# Patient Record
Sex: Male | Born: 1955 | Race: White | Hispanic: No | Marital: Married | Smoking: Never smoker
Health system: Southern US, Community
[De-identification: ages and names within clinical notes are randomized; demographics above are authoritative.]

## PROBLEM LIST (undated history)

## (undated) DIAGNOSIS — E785 Hyperlipidemia, unspecified: Secondary | ICD-10-CM

## (undated) DIAGNOSIS — E119 Type 2 diabetes mellitus without complications: Secondary | ICD-10-CM

## (undated) DIAGNOSIS — I10 Essential (primary) hypertension: Secondary | ICD-10-CM

## (undated) HISTORY — PX: KNEE SURGERY: SHX244

## (undated) HISTORY — PX: TONSILLECTOMY: SUR1361

---

## 2019-04-04 DIAGNOSIS — R350 Frequency of micturition: Secondary | ICD-10-CM | POA: Insufficient documentation

## 2019-04-04 DIAGNOSIS — N5203 Combined arterial insufficiency and corporo-venous occlusive erectile dysfunction: Secondary | ICD-10-CM | POA: Insufficient documentation

## 2019-04-08 DIAGNOSIS — N486 Induration penis plastica: Secondary | ICD-10-CM | POA: Insufficient documentation

## 2019-06-02 ENCOUNTER — Emergency Department (HOSPITAL_BASED_OUTPATIENT_CLINIC_OR_DEPARTMENT_OTHER)
Admission: EM | Admit: 2019-06-02 | Discharge: 2019-06-02 | Disposition: A | Discharge status: Home / Self Care | Payer: 59 | Attending: Emergency Medicine | Admitting: Emergency Medicine

## 2019-06-02 ENCOUNTER — Emergency Department (HOSPITAL_BASED_OUTPATIENT_CLINIC_OR_DEPARTMENT_OTHER): Payer: 59

## 2019-06-02 ENCOUNTER — Other Ambulatory Visit: Payer: Self-pay

## 2019-06-02 ENCOUNTER — Encounter (HOSPITAL_BASED_OUTPATIENT_CLINIC_OR_DEPARTMENT_OTHER): Payer: Self-pay

## 2019-06-02 DIAGNOSIS — E785 Hyperlipidemia, unspecified: Secondary | ICD-10-CM | POA: Insufficient documentation

## 2019-06-02 DIAGNOSIS — Z7984 Long term (current) use of oral hypoglycemic drugs: Secondary | ICD-10-CM | POA: Insufficient documentation

## 2019-06-02 DIAGNOSIS — E119 Type 2 diabetes mellitus without complications: Secondary | ICD-10-CM | POA: Diagnosis not present

## 2019-06-02 DIAGNOSIS — I10 Essential (primary) hypertension: Secondary | ICD-10-CM | POA: Insufficient documentation

## 2019-06-02 DIAGNOSIS — Z79899 Other long term (current) drug therapy: Secondary | ICD-10-CM | POA: Diagnosis not present

## 2019-06-02 DIAGNOSIS — R079 Chest pain, unspecified: Secondary | ICD-10-CM

## 2019-06-02 DIAGNOSIS — R0789 Other chest pain: Secondary | ICD-10-CM | POA: Insufficient documentation

## 2019-06-02 HISTORY — DX: Type 2 diabetes mellitus without complications: E11.9

## 2019-06-02 HISTORY — DX: Essential (primary) hypertension: I10

## 2019-06-02 HISTORY — DX: Hyperlipidemia, unspecified: E78.5

## 2019-06-02 LAB — BASIC METABOLIC PANEL
Anion gap: 9 (ref 5–15)
BUN: 15 mg/dL (ref 8–23)
CO2: 23 mmol/L (ref 22–32)
Calcium: 9 mg/dL (ref 8.9–10.3)
Chloride: 104 mmol/L (ref 98–111)
Creatinine, Ser: 0.86 mg/dL (ref 0.61–1.24)
GFR calc Af Amer: >60 mL/min (ref >60)
GFR calc non Af Amer: >60 mL/min (ref >60)
Glucose, Bld: 210 mg/dL — ABNORMAL HIGH (ref 70–99)
Potassium: 4.4 mmol/L (ref 3.5–5.1)
Sodium: 136 mmol/L (ref 135–145)

## 2019-06-02 LAB — HEPATIC FUNCTION PANEL
ALT: 25 U/L (ref 0–44)
AST: 21 U/L (ref 15–41)
Albumin: 4.3 g/dL (ref 3.5–5.0)
Alkaline Phosphatase: 87 U/L (ref 38–126)
Bilirubin, Direct: 0.1 mg/dL (ref 0.0–0.2)
Indirect Bilirubin: 0.5 mg/dL (ref 0.3–0.9)
Total Bilirubin: 0.6 mg/dL (ref 0.3–1.2)
Total Protein: 7.3 g/dL (ref 6.5–8.1)

## 2019-06-02 LAB — CBC WITH DIFFERENTIAL/PLATELET
Abs Immature Granulocytes: 0.02 10*3/uL (ref 0.00–0.07)
Basophils Absolute: 0.1 10*3/uL (ref 0.0–0.1)
Basophils Relative: 1 %
Eosinophils Absolute: 0.2 10*3/uL (ref 0.0–0.5)
Eosinophils Relative: 3 %
HCT: 42.5 % (ref 39.0–52.0)
Hemoglobin: 14.4 g/dL (ref 13.0–17.0)
Immature Granulocytes: 0 %
Lymphocytes Relative: 22 %
Lymphs Abs: 1.2 10*3/uL (ref 0.7–4.0)
MCH: 30.4 pg (ref 26.0–34.0)
MCHC: 33.9 g/dL (ref 30.0–36.0)
MCV: 89.7 fL (ref 80.0–100.0)
Monocytes Absolute: 0.7 10*3/uL (ref 0.1–1.0)
Monocytes Relative: 12 %
Neutro Abs: 3.5 10*3/uL (ref 1.7–7.7)
Neutrophils Relative %: 62 %
Platelets: 221 10*3/uL (ref 150–400)
RBC: 4.74 MIL/uL (ref 4.22–5.81)
RDW: 13 % (ref 11.5–15.5)
WBC: 5.6 10*3/uL (ref 4.0–10.5)
nRBC: 0 % (ref 0.0–0.2)

## 2019-06-02 LAB — LIPASE, BLOOD: Lipase: 34 U/L (ref 11–51)

## 2019-06-02 LAB — TROPONIN I (HIGH SENSITIVITY)
Troponin I (High Sensitivity): 3 ng/L (ref <18)
Troponin I (High Sensitivity): 3 ng/L (ref <18)

## 2019-06-02 NOTE — ED Notes (Signed)
ED Provider at bedside. 

## 2019-06-02 NOTE — ED Provider Notes (Signed)
[facility] EMERGENCY DEPARTMENT Provider Note   CSN: [id number] Arrival date & time: 06/02/19  1442     History   Chief Complaint Chief Complaint  Patient presents with  . Chest Pain    HPI Sean Buckley is a 63 y.o. male.     The history is provided by the patient.  Chest Pain Pain location:  Substernal area Pain quality: aching   Pain radiates to:  L jaw Pain severity:  Mild Onset quality:  Gradual Duration:  1 hour Progression:  Resolved Chronicity:  New Context: at rest   Relieved by:  Nothing Worsened by:  Nothing Associated symptoms: no abdominal pain, no back pain, no cough, no fever, no palpitations, no shortness of breath and no vomiting   Risk factors: diabetes mellitus and high cholesterol   Risk factors: no coronary artery disease, no hypertension (not on medications) and no prior DVT/PE     Past Medical History:  Diagnosis Date  . Diabetes mellitus without complication ([place])   . Hyperlipidemia   . Hypertension     There are no active problems to display for this patient.   Past Surgical History:  Procedure Laterality Date  . KNEE SURGERY Left         Home Medications    Prior to Admission medications   Medication Sig Start Date End Date Taking? Authorizing Provider  ALPRAZolam (XANAX) 1 MG tablet TAKE 1/2 TAB(S) AT BEDTIME AS NEEDED ORALLY 60 DAYS 02/20/19  Yes [provider]  atorvastatin (LIPITOR) 40 MG tablet TAKE 1 TABLET BY MOUTH EVERYDAY AT BEDTIME 01/18/19  Yes [provider]  buPROPion (WELLBUTRIN XL) 150 MG 24 hr tablet TAKE 2 TAB(S) EVERY MORNING 1 TAB EVERY EVENING 2 TIMES DAILY ORALLY 90 DAYS 02/21/19  Yes [provider]  metFORMIN (GLUCOPHAGE) 500 MG tablet Take 500 mg by mouth 2 (two) times daily with a meal.   Yes [provider]    Family History No family history on file.  Social History Social History   Tobacco Use  . Smoking status: Never Smoker  . Smokeless tobacco:  Never Used  Substance Use Topics  . Alcohol use: Yes    Comment: 2 glasses of wine per week   . Drug use: Never     Allergies   Patient has no allergy information on record.   Review of Systems Review of Systems  Constitutional: Negative for chills and fever.  HENT: Negative for ear pain and sore throat.   Eyes: Negative for pain and visual disturbance.  Respiratory: Negative for cough and shortness of breath.   Cardiovascular: Positive for chest pain. Negative for palpitations.  Gastrointestinal: Negative for abdominal pain and vomiting.  Genitourinary: Negative for dysuria and hematuria.  Musculoskeletal: Negative for arthralgias and back pain.  Skin: Negative for color change and rash.  Neurological: Negative for seizures and syncope.  All other systems reviewed and are negative.    Physical Exam Updated Vital Signs  ED Triage Vitals  Enc Vitals Group     BP 06/02/19 1453 137/70     Pulse Rate 06/02/19 1453 83     Resp 06/02/19 1453 14     Temp 06/02/19 1453 98.8 F (37.1 C)     Temp Source 06/02/19 1453 Oral     SpO2 06/02/19 1453 97 %     Weight 06/02/19 1447 220 lb (99.8 kg)     Height 06/02/19 1447 6\' 2"  (1.88 m)     Head Circumference --  Peak Flow --      Pain Score 06/02/19 1447 0     Pain Loc --      Pain Edu? --      Excl. in GC? --     Physical Exam Vitals signs and nursing note reviewed.  Constitutional:      Appearance: He is well-developed.  HENT:     Head: Normocephalic and atraumatic.  Eyes:     Extraocular Movements: Extraocular movements intact.     Conjunctiva/sclera: Conjunctivae normal.     Pupils: Pupils are equal, round, and reactive to light.  Neck:     Musculoskeletal: Normal range of motion and neck supple.  Cardiovascular:     Rate and Rhythm: Normal rate and regular rhythm.     Pulses:          Radial pulses are 2+ on the right side and 2+ on the left side.       Dorsalis pedis pulses are 2+ on the right side and 2+  on the left side.     Heart sounds: Normal heart sounds. No murmur.  Pulmonary:     Effort: Pulmonary effort is normal. No respiratory distress.     Breath sounds: Normal breath sounds. No decreased breath sounds, wheezing, rhonchi or rales.  Abdominal:     Palpations: Abdomen is soft.     Tenderness: There is no abdominal tenderness.  Musculoskeletal: Normal range of motion.  Skin:    General: Skin is warm and dry.     Capillary Refill: Capillary refill takes less than 2 seconds.  Neurological:     General: No focal deficit present.     Mental Status: He is alert.  Psychiatric:        Mood and Affect: Mood normal.      ED Treatments / Results  Labs (all labs ordered are listed, but only abnormal results are displayed) Labs Reviewed  BASIC METABOLIC PANEL - Abnormal; Notable for the following components:      Result Value   Glucose, Bld 210 (*)    All other components within normal limits  CBC WITH DIFFERENTIAL/PLATELET  HEPATIC FUNCTION PANEL  LIPASE, BLOOD  TROPONIN I (HIGH SENSITIVITY)  TROPONIN I (HIGH SENSITIVITY)    EKG EKG Interpretation  Date/Time:  Thursday June 02 2019 14:48:39 EST Ventricular Rate:  84 PR Interval:  164 QRS Duration: 82 QT Interval:  358 QTC Calculation: 423 R Axis:   82 Text Interpretation: Normal sinus rhythm Normal ECG Confirmed by Virgina Norfolk [phone]) on 06/02/2019 2:57:00 PM   Radiology Dg Chest Portable 1 View  Result Date: 06/02/2019 CLINICAL DATA:  Chest pain. EXAM: PORTABLE CHEST 1 VIEW COMPARISON:  None. FINDINGS: The heart size and mediastinal contours are within normal limits. Both lungs are clear. The visualized skeletal structures are unremarkable. IMPRESSION: No active disease. Electronically Signed   By: Gerome Sam III M.D   On: 06/02/2019 15:27    Procedures Procedures (including critical care time)  Medications Ordered in ED Medications - No data to display   Initial Impression / Assessment and  Plan / ED Course  I have reviewed the triage vital signs and the nursing notes.  Pertinent labs & imaging results that were available during my care of the patient were reviewed by me and considered in my medical decision making (see chart for details).     Sean Buckley is a 63 year old male with history of high cholesterol, diabetes who presents to the ED with chest pain.  Patient with unremarkable vitals.  No fever.  Patient had episode of chest pain about an hour ago that was pressure-like and possibly radiating to his jaw.  However patient states that he has some jaw pain at baseline due to dental problems.  Is not having pain now.  States that maybe he had an episode of pain 2 days ago.  Patient denies having high blood pressure.  Used to be on blood pressure medication in the past but has not been on it for several months.  Patient does not smoke.  No history of blood clots.  Does not have any risk factors for PE.  Wells criteria 0.  Heart score is 3.  EKG shows sinus rhythm.  No ischemic changes.  Will get 2 troponins to evaluate for ACS.  Does not have any infectious symptoms.  Will get labs, chest x-ray.  No significant anemia, electrolyte abnormality, kidney injury.  Troponin normal x2.  Chest x-ray with no signs of pneumonia, no pneumothorax, no pleural effusion.  Lipase normal doubt pancreatitis.  Overall, recommend close outpatient follow-up with cardiology for further work-up.  Patient prefers this as well, patient does have a heart score of 3 and think that it is reasonable for close outpatient follow-up to discuss possible stress test.  Given return precautions and discharged in ED good condition.  This chart was dictated using voice recognition software.  Despite best efforts to proofread,  errors can occur which can change the documentation meaning.    Final Clinical Impressions(s) / ED Diagnoses   Final diagnoses:  Chest pain, unspecified type    ED Discharge Orders    None        Virgina NorfolkCuratolo, Shamus Desantis, DO 06/02/19 1823

## 2019-06-02 NOTE — ED Triage Notes (Signed)
Pt reports that he has been having chest pain for a few days reporting that his wife made him come. He reports that he has had chest pain off and on for years, does not see a cardiologist.

## 2019-06-02 NOTE — ED Notes (Signed)
Pt reports he had substernal CP with an onset while at rest. Pt reports the pain lasted for 15 minutes and then was resolved. Happened x2 over past couple of days. Pt reports he has some jaw pain which pt relates to being a chronic pain from a previous dental surgery. Pt denies ShOB, nausea, diaphoresis with onset. Pt is pain free at this time.

## 2020-04-15 ENCOUNTER — Encounter (HOSPITAL_BASED_OUTPATIENT_CLINIC_OR_DEPARTMENT_OTHER): Payer: Self-pay | Admitting: Emergency Medicine

## 2020-04-15 ENCOUNTER — Other Ambulatory Visit: Payer: Self-pay

## 2020-04-15 ENCOUNTER — Emergency Department (HOSPITAL_BASED_OUTPATIENT_CLINIC_OR_DEPARTMENT_OTHER)
Admission: EM | Admit: 2020-04-15 | Discharge: 2020-04-16 | Disposition: A | Discharge status: Home / Self Care | Payer: BC Managed Care – PPO | Attending: Emergency Medicine | Admitting: Emergency Medicine

## 2020-04-15 DIAGNOSIS — E119 Type 2 diabetes mellitus without complications: Secondary | ICD-10-CM | POA: Insufficient documentation

## 2020-04-15 DIAGNOSIS — R109 Unspecified abdominal pain: Secondary | ICD-10-CM | POA: Diagnosis present

## 2020-04-15 DIAGNOSIS — K5900 Constipation, unspecified: Secondary | ICD-10-CM | POA: Diagnosis not present

## 2020-04-15 DIAGNOSIS — I10 Essential (primary) hypertension: Secondary | ICD-10-CM | POA: Insufficient documentation

## 2020-04-15 DIAGNOSIS — Z7984 Long term (current) use of oral hypoglycemic drugs: Secondary | ICD-10-CM | POA: Diagnosis not present

## 2020-04-15 DIAGNOSIS — Z20822 Contact with and (suspected) exposure to covid-19: Secondary | ICD-10-CM | POA: Insufficient documentation

## 2020-04-15 DIAGNOSIS — R1084 Generalized abdominal pain: Secondary | ICD-10-CM

## 2020-04-15 LAB — COMPREHENSIVE METABOLIC PANEL
ALT: 29 U/L (ref 0–44)
AST: 28 U/L (ref 15–41)
Albumin: 4.3 g/dL (ref 3.5–5.0)
Alkaline Phosphatase: 67 U/L (ref 38–126)
Anion gap: 10 (ref 5–15)
BUN: 12 mg/dL (ref 8–23)
CO2: 24 mmol/L (ref 22–32)
Calcium: 8.8 mg/dL — ABNORMAL LOW (ref 8.9–10.3)
Chloride: 104 mmol/L (ref 98–111)
Creatinine, Ser: 0.75 mg/dL (ref 0.61–1.24)
GFR calc Af Amer: >60 mL/min (ref >60)
GFR calc non Af Amer: >60 mL/min (ref >60)
Glucose, Bld: 148 mg/dL — ABNORMAL HIGH (ref 70–99)
Potassium: 3.8 mmol/L (ref 3.5–5.1)
Sodium: 138 mmol/L (ref 135–145)
Total Bilirubin: 0.8 mg/dL (ref 0.3–1.2)
Total Protein: 7.2 g/dL (ref 6.5–8.1)

## 2020-04-15 LAB — CBC
HCT: 39.7 % (ref 39.0–52.0)
Hemoglobin: 13.7 g/dL (ref 13.0–17.0)
MCH: 30.9 pg (ref 26.0–34.0)
MCHC: 34.5 g/dL (ref 30.0–36.0)
MCV: 89.4 fL (ref 80.0–100.0)
Platelets: 189 10*3/uL (ref 150–400)
RBC: 4.44 MIL/uL (ref 4.22–5.81)
RDW: 12.6 % (ref 11.5–15.5)
WBC: 5.9 10*3/uL (ref 4.0–10.5)
nRBC: 0 % (ref 0.0–0.2)

## 2020-04-15 LAB — LIPASE, BLOOD: Lipase: 32 U/L (ref 11–51)

## 2020-04-15 MED ORDER — SODIUM CHLORIDE 0.9 % IV BOLUS
1000.0000 mL | Freq: Once | INTRAVENOUS | Status: AC
  Administered 2020-04-16: 1000 mL via INTRAVENOUS

## 2020-04-15 MED ORDER — ONDANSETRON HCL 4 MG/2ML IJ SOLN
4.0000 mg | Freq: Once | INTRAMUSCULAR | Status: AC
  Administered 2020-04-16: 4 mg via INTRAVENOUS
  Filled 2020-04-15: qty 2

## 2020-04-15 NOTE — ED Triage Notes (Signed)
Pt states he has not had a bowel movement in 10 days  Pt is c/o nausea and states he has had a fever  Pt is having abd pain  Pt states he has been able to pass gas  Pt states he has passed some stool but it is hard

## 2020-04-16 ENCOUNTER — Emergency Department (HOSPITAL_BASED_OUTPATIENT_CLINIC_OR_DEPARTMENT_OTHER): Payer: BC Managed Care – PPO

## 2020-04-16 ENCOUNTER — Encounter (HOSPITAL_BASED_OUTPATIENT_CLINIC_OR_DEPARTMENT_OTHER): Payer: Self-pay

## 2020-04-16 LAB — URINALYSIS, ROUTINE W REFLEX MICROSCOPIC
Bilirubin Urine: NEGATIVE
Glucose, UA: NEGATIVE mg/dL
Hgb urine dipstick: NEGATIVE
Ketones, ur: NEGATIVE mg/dL
Leukocytes,Ua: NEGATIVE
Nitrite: NEGATIVE
Protein, ur: NEGATIVE mg/dL
Specific Gravity, Urine: 1.01 (ref 1.005–1.030)
pH: 6 (ref 5.0–8.0)

## 2020-04-16 LAB — RESPIRATORY PANEL BY RT PCR (FLU A&B, COVID)
Influenza A by PCR: NEGATIVE
Influenza B by PCR: NEGATIVE
SARS Coronavirus 2 by RT PCR: NEGATIVE

## 2020-04-16 MED ORDER — IOHEXOL 300 MG/ML  SOLN
100.0000 mL | Freq: Once | INTRAMUSCULAR | Status: AC | PRN
  Administered 2020-04-16: 100 mL via INTRAVENOUS

## 2020-04-16 MED ORDER — DIPHENHYDRAMINE HCL 50 MG/ML IJ SOLN
25.0000 mg | Freq: Once | INTRAMUSCULAR | Status: AC
  Administered 2020-04-16: 25 mg via INTRAVENOUS
  Filled 2020-04-16: qty 1

## 2020-04-16 NOTE — ED Notes (Signed)
Patient transported to CT 

## 2020-04-16 NOTE — ED Notes (Signed)
Pt returned from CT °

## 2020-04-16 NOTE — ED Provider Notes (Signed)
[facility] EMERGENCY DEPARTMENT Provider Note   CSN: [id number] Arrival date & time: 04/15/20  2116     History Chief Complaint  Patient presents with  . Constipation    Aries Townley is a 64 y.o. male.  HPI     This is a 64 year old male with history of diabetes, hypertension, hyperlipidemia who presents with constipation, abdominal pain, nausea.  Patient reports he has recently had a change in diet.  Over the last 10 days he has had difficulty having a bowel movement.  He reports using multiple medications including stool softeners and fleets enema with minimal results.  Bowel movements he has had have been very hard and small.  Today he developed chills and nausea.  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  He is vaccinated.  Patient reports some left-sided abdominal discomfort that is nonradiating and dull in nature.  He denies any vomiting, known fevers, urinary symptoms.  Past Medical History:  Diagnosis Date  . Diabetes mellitus without complication (H[facility])   . Hyperlipidemia   . Hypertension     There are no problems to display for this patient.   Past Surgical History:  Procedure Laterality Date  . KNEE SURGERY Left        Family History  Problem Relation Age of Onset  . Diabetes Other   . Cancer Other   . Rheum arthritis Other     Social History   Tobacco Use  . Smoking status: Never Smoker  . Smokeless tobacco: Never Used  Vaping Use  . Vaping Use: Never used  Substance Use Topics  . Alcohol use: Yes    Comment: 10 oz per week   . Drug use: Never    Home Medications Prior to Admission medications   Medication Sig Start Date End Date Taking? Authorizing Provider  ALPRAZolam (XANAX) 1 MG tablet TAKE 1/2 TAB(S) AT BEDTIME AS NEEDED ORALLY 60 DAYS 02/20/19   [provider]  atorvastatin (LIPITOR) 40 MG tablet TAKE 1 TABLET BY MOUTH EVERYDAY AT BEDTIME 01/18/19   [provider]  buPROPion (WELLBUTRIN XL) 150 MG 24 hr tablet TAKE 2 TAB(S) EVERY MORNING 1 TAB EVERY EVENING 2 TIMES DAILY ORALLY 90 DAYS 02/21/19   [provider]  metFORMIN (GLUCOPHAGE) 500 MG tablet Take 500 mg by mouth 2 (two) times daily with a meal.    [provider]    Allergies    Patient has no known allergies.  Review of Systems   Review of Systems  Constitutional: Positive for chills and fever.  Respiratory: Negative for shortness of breath.   Gastrointestinal: Positive for constipation and nausea. Negative for blood in stool, diarrhea and vomiting.  Genitourinary: Negative for dysuria.  All other systems reviewed and are negative.   Physical Exam Updated Vital Signs BP (!) 138/98 (BP Location: Left Arm)   Pulse 78   Temp 100.3 F (37.9 C) (Oral)   Resp 20   Ht 1.88 m (6\' 2" )   Wt 98 kg   SpO2 99%   BMI 27.73 kg/m   Physical Exam Vitals and nursing note reviewed.  Constitutional:      Appearance: He is well-developed. He is not ill-appearing.  HENT:     Head: Normocephalic and atraumatic.     Mouth/Throat:     Mouth: Mucous membranes are dry.  Eyes:     Pupils: Pupils are equal, round, and reactive to light.  Cardiovascular:  Rate and Rhythm: Normal rate and regular rhythm.     Heart sounds: Normal heart sounds. No murmur heard.   Pulmonary:     Effort: Pulmonary effort is normal. No respiratory distress.     Breath sounds: Normal breath sounds. No wheezing.  Abdominal:     Palpations: Abdomen is soft.     Tenderness: There is no abdominal tenderness. There is no rebound.     Comments: Slightly increased bowel sounds, diffuse tenderness to palpation mostly over the left upper and lower quadrants  Musculoskeletal:     Cervical back: Neck supple.     Right lower leg: No edema.     Left lower leg: No edema.  Lymphadenopathy:     Cervical: No cervical adenopathy.  Skin:    General: Skin is warm and dry.  Neurological:     Mental  Status: He is alert and oriented to person, place, and time.  Psychiatric:        Mood and Affect: Mood normal.     ED Results / Procedures / Treatments   Labs (all labs ordered are listed, but only abnormal results are displayed) Labs Reviewed  COMPREHENSIVE METABOLIC PANEL - Abnormal; Notable for the following components:      Result Value   Glucose, Bld 148 (*)    Calcium 8.8 (*)    All other components within normal limits  RESPIRATORY PANEL BY RT PCR (FLU A&B, COVID)  LIPASE, BLOOD  CBC  URINALYSIS, ROUTINE W REFLEX MICROSCOPIC    EKG None  Radiology CT ABDOMEN PELVIS W CONTRAST  Result Date: 04/16/2020 CLINICAL DATA:  Nausea and abdominal pain. No bowel movement for 10 days. EXAM: CT ABDOMEN AND PELVIS WITH CONTRAST TECHNIQUE: Multidetector CT imaging of the abdomen and pelvis was performed using the standard protocol following bolus administration of intravenous contrast. CONTRAST:  OMNIPAQUE IOHEXOL 300 MG/ML  SOLN COMPARISON:  None. FINDINGS: Lower chest: The lung bases are clear. Hepatobiliary: No focal liver abnormality is seen. No gallstones, gallbladder wall thickening, or biliary dilatation. Pancreas: Unremarkable. No pancreatic ductal dilatation or surrounding inflammatory changes. Spleen: Normal in size without focal abnormality. Adrenals/Urinary Tract: Adrenal glands are unremarkable. Kidneys are normal, without renal calculi, focal lesion, or hydronephrosis. Bladder is unremarkable. Stomach/Bowel: Stomach is decompressed. Colon is diffusely stool-filled. Fluid-filled nondistended small bowel. No wall thickening or inflammatory changes appreciated. Appendix is normal. Vascular/Lymphatic: Aortic atherosclerosis. No enlarged abdominal or pelvic lymph nodes. Reproductive: Prostate is unremarkable. Other: Small left inguinal hernia containing fat. No free air or free fluid in the abdomen. Musculoskeletal: No destructive bone lesions. Degenerative changes in the spine.  IMPRESSION: 1. Diffusely stool-filled colon consistent with constipation. No evidence of bowel obstruction or inflammation. 2. Small left inguinal hernia containing fat. 3. Aortic atherosclerosis. Aortic Atherosclerosis (ICD10-I70.0). Electronically Signed   By: Burman Nieves M.D.   On: 04/16/2020 01:20    Procedures Procedures (including critical care time)  Medications Ordered in ED Medications  sodium chloride 0.9 % bolus 1,000 mL (1,000 mLs Intravenous New Bag/Given 04/16/20 0019)  ondansetron (ZOFRAN) injection 4 mg (4 mg Intravenous Given 04/16/20 0020)  iohexol (OMNIPAQUE) 300 MG/ML solution 100 mL (100 mLs Intravenous Contrast Given 04/16/20 0105)  diphenhydrAMINE (BENADRYL) injection 25 mg (25 mg Intravenous Given 04/16/20 0020)    ED Course  I have reviewed the triage vital signs and the nursing notes.  Pertinent labs & imaging results that were available during my care of the patient were reviewed by me and considered in my  medical decision making (see chart for details).    MDM Rules/Calculators/A&P                           Patient presents with constipation.  Also reports recent onset of chills.  He is overall nontoxic-appearing and vital signs are notable for temperature of 100.3.  Given his history, the seem like 2 separate issues.  He is concerned for possible Covid although he has been vaccinated.  Additionally regarding his abdominal pain, considerations include constipation, obstruction, less likely intra-abdominal pathology such as cholecystitis or appendicitis.  He does have hyperactive bowel sounds.  Lab work obtained and reviewed.  No significant metabolic derangements, LFTs and lipase are normal.  Covid testing was sent.  CT abdomen and pelvis was obtained to rule out obstructive process.  Covid testing is negative.  CT scan just shows significant constipation.  He is not currently on a laxative but has had stool softeners and enema.  Recommend MiraLAX 1 capful 2-3  times a day.  Follow-up with GI provided.  Kaeo Jacome was evaluated in Emergency Department on 04/16/2020 for the symptoms described in the history of present illness. He was evaluated in the context of the global COVID-19 pandemic, which necessitated consideration that the patient might be at risk for infection with the SARS-CoV-2 virus that causes COVID-19. Institutional protocols and algorithms that pertain to the evaluation of patients at risk for COVID-19 are in a state of rapid change based on information released by regulatory bodies including the CDC and federal and state organizations. These policies and algorithms were followed during the patient's care in the ED.  After history, exam, and medical workup I feel the patient has been appropriately medically screened and is safe for discharge home. Pertinent diagnoses were discussed with the patient. Patient was given return precautions.   Final Clinical Impression(s) / ED Diagnoses Final diagnoses:  Generalized abdominal pain  Constipation, unspecified constipation type    Rx / DC Orders ED Discharge Orders    None       Wilkie Aye, Mayer Masker, MD 04/16/20 [phone]

## 2020-04-16 NOTE — ED Notes (Signed)
Pt states nausea has improved at this time. Zofran held. CT tech at bedside

## 2020-04-16 NOTE — Discharge Instructions (Signed)
You are seen today for abdominal pain and constipation.  Your work-up is reassuring.  There are no obstructions on her CT scan.  You do clinically appear constipated.  Take MiraLAX 1 capful 2-3 times daily to initiate bowel movements.  You may titrate upwards.  You were also noted to have some upper respiratory symptoms and chills.  Your temperature was 100.3.  Covid testing was negative.

## 2020-11-12 IMAGING — DX DG CHEST 1V PORT
2 series · 2 of 2 positions shown · non-contrast
Comparison: None.

CLINICAL DATA: Chest pain.

EXAM:
PORTABLE CHEST 1 VIEW

[chest ap (1 of 2)]
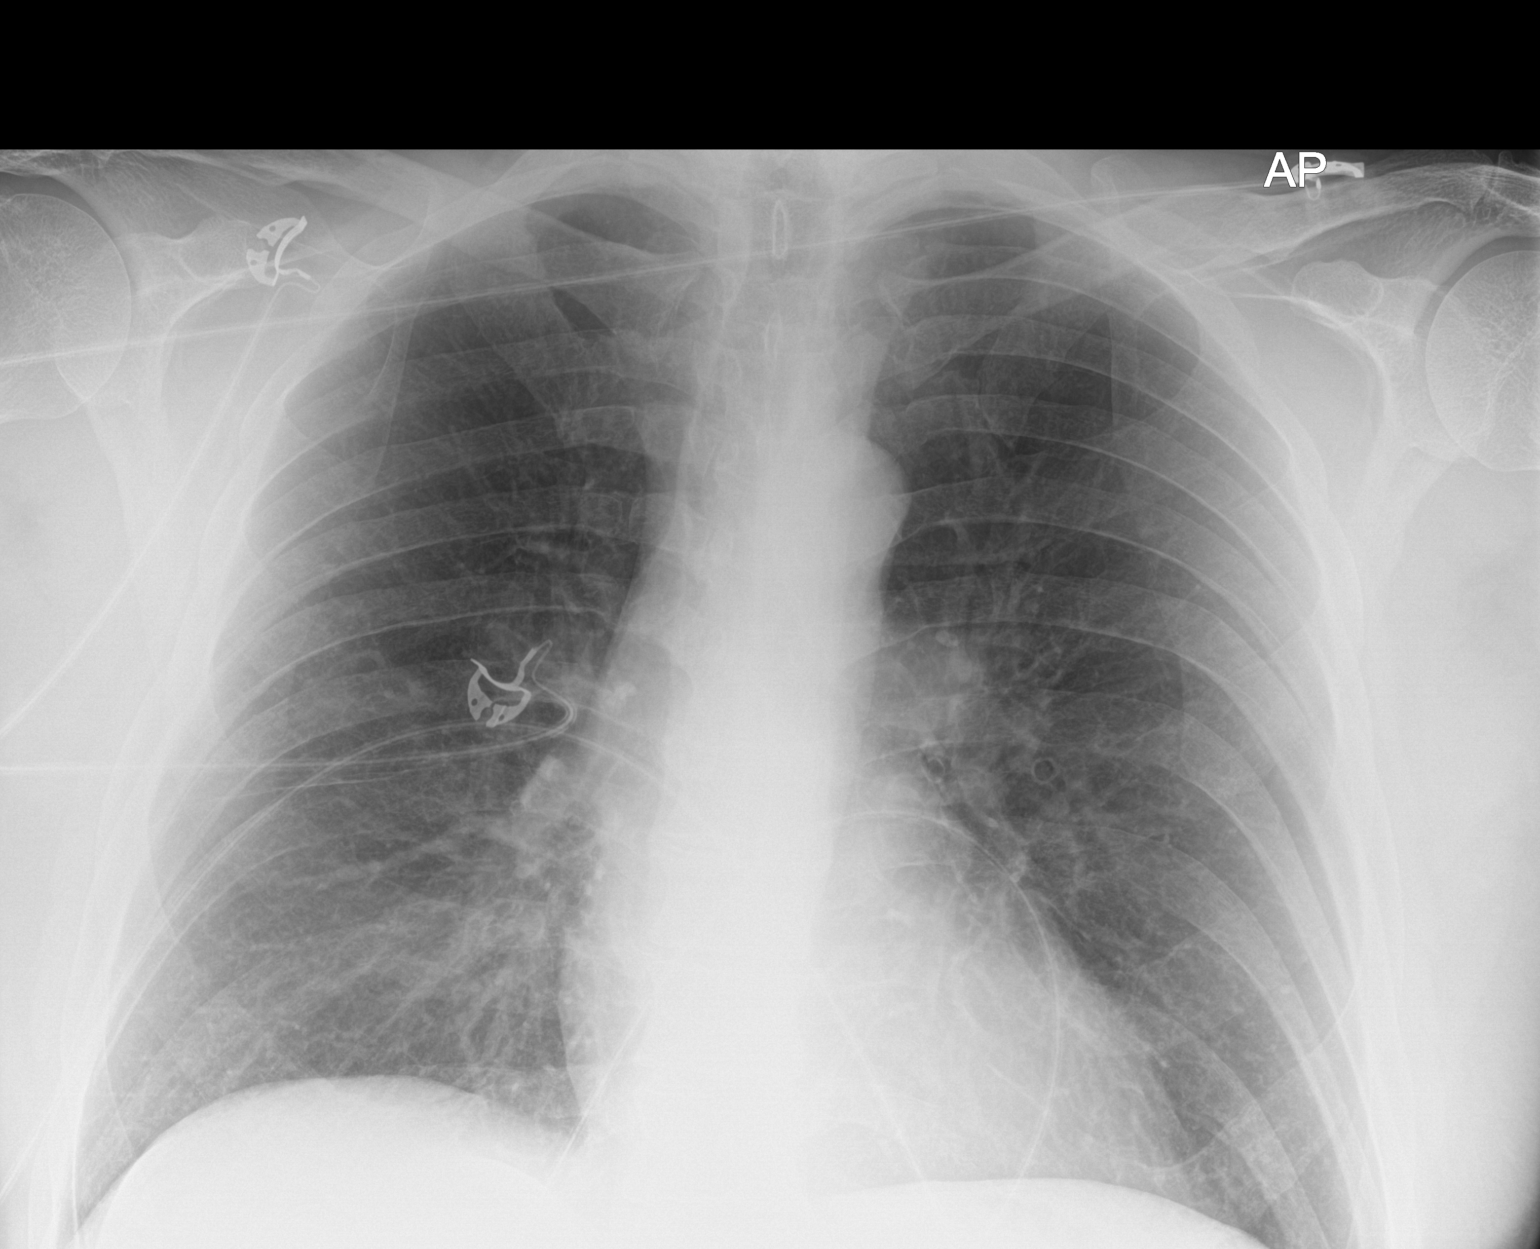

[chest ap (2 of 2)]
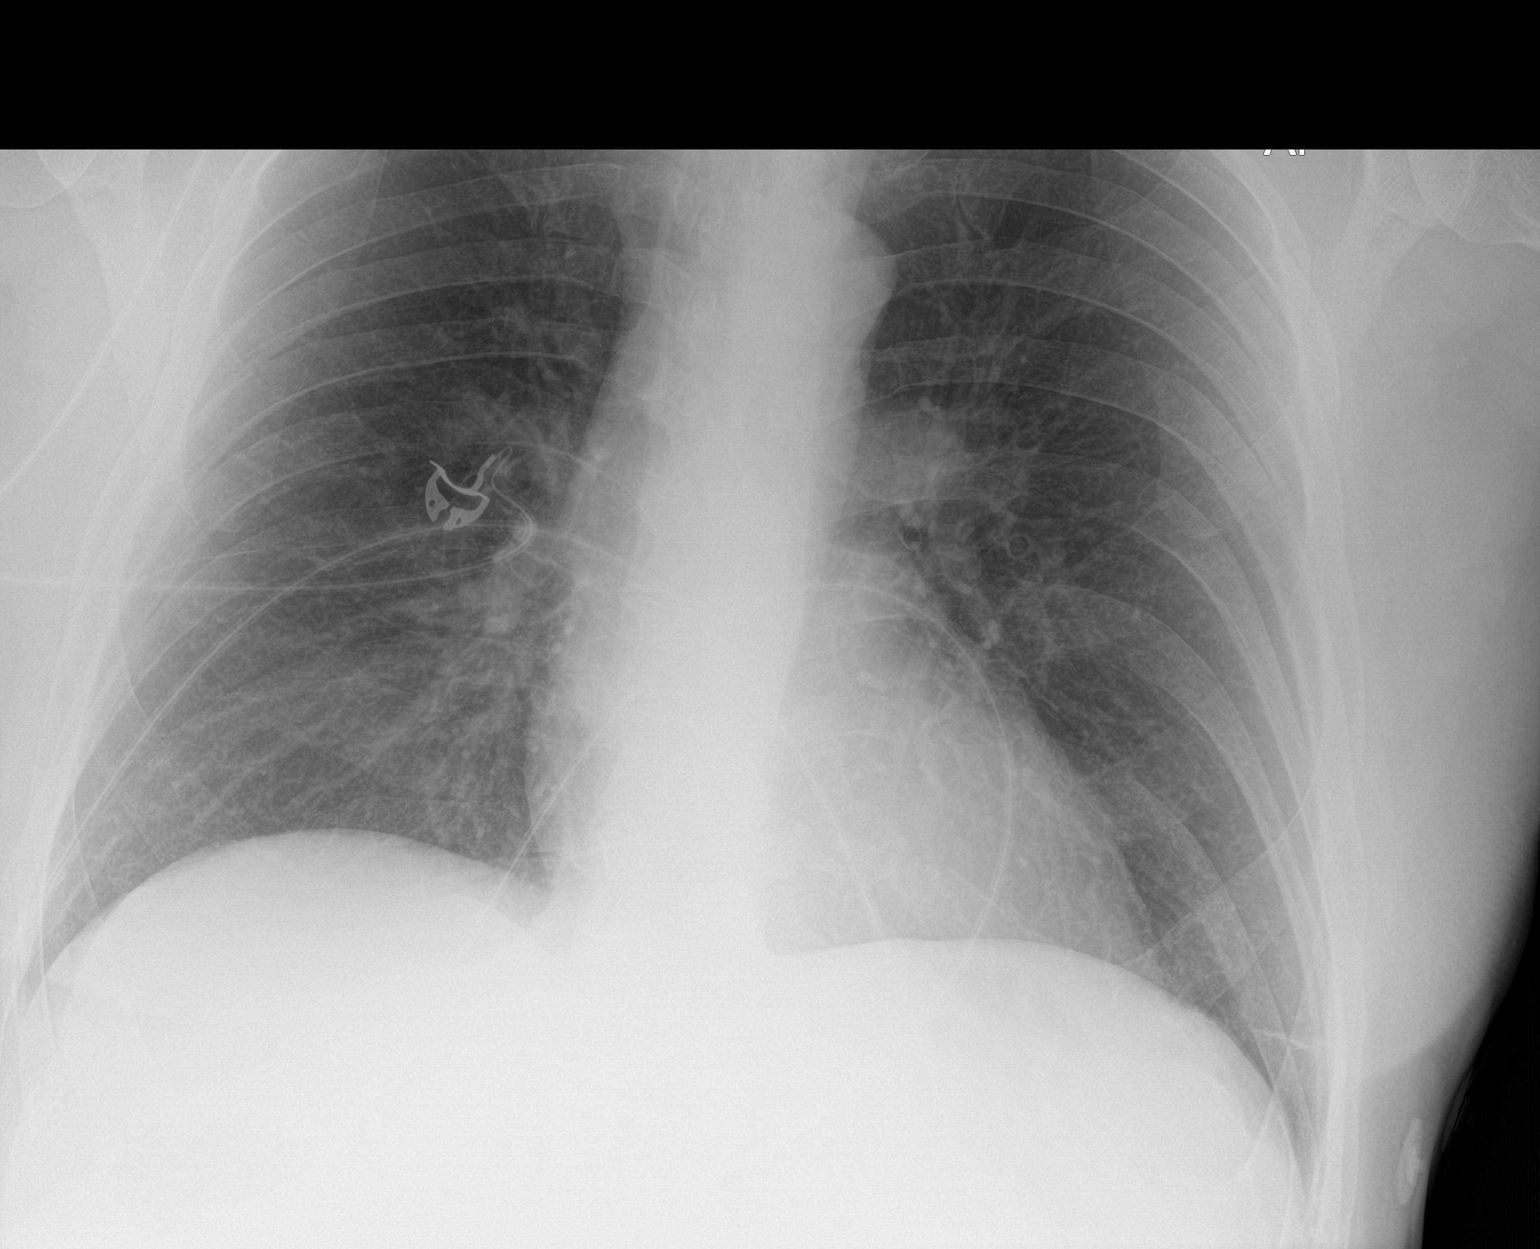

[2 of 2 positions shown; findings below may reference images not displayed]

FINDINGS: The heart size and mediastinal contours are within normal limits.
Both lungs are clear. The visualized skeletal structures are
unremarkable.
IMPRESSION: No active disease.

## 2021-09-27 IMAGING — CT CT ABD-PELV W/ CM
2 of 5 series · 17 of 46 positions shown, 19 images · IV contrast (Omnipaque)
Comparison: None.

CLINICAL DATA: Nausea and abdominal pain. No bowel movement for 10
days.

EXAM:
CT ABDOMEN AND PELVIS WITH CONTRAST
TECHNIQUE: Multidetector CT imaging of the abdomen and pelvis was performed
using the standard protocol following bolus administration of
intravenous contrast.
CONTRAST:  100mL OMNIPAQUE IOHEXOL 300 MG/ML  SOLN

[Series 2: axial st · axial · 0.86mm/px · z∈[-470,-55]mm · 14 of 95 slices shown, 16 images]
[im 6/95  soft-tissue]
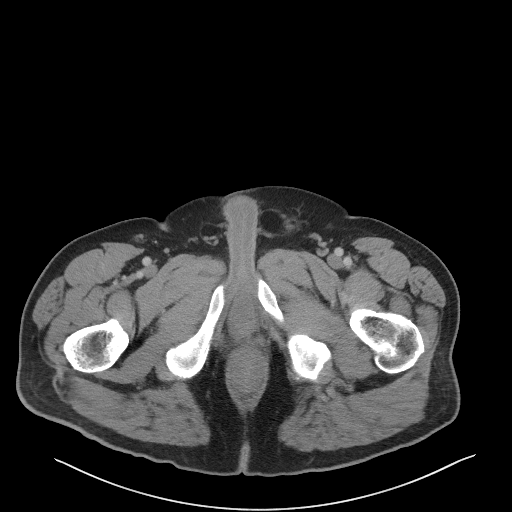
[im 6/95  bone]
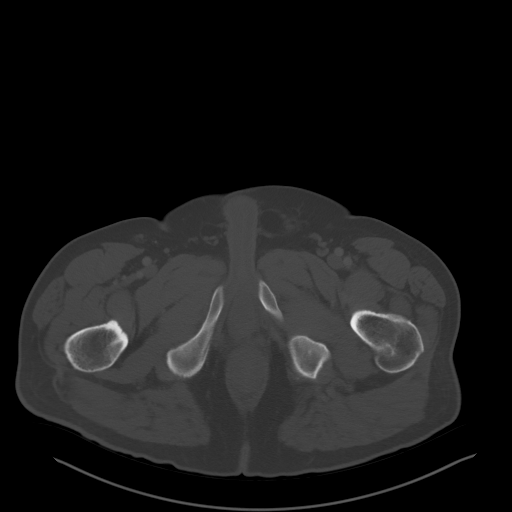
[im 11/95  soft-tissue]
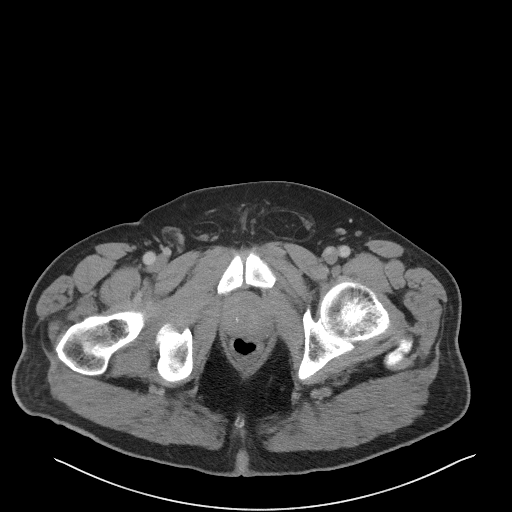
[im 21/95  soft-tissue]
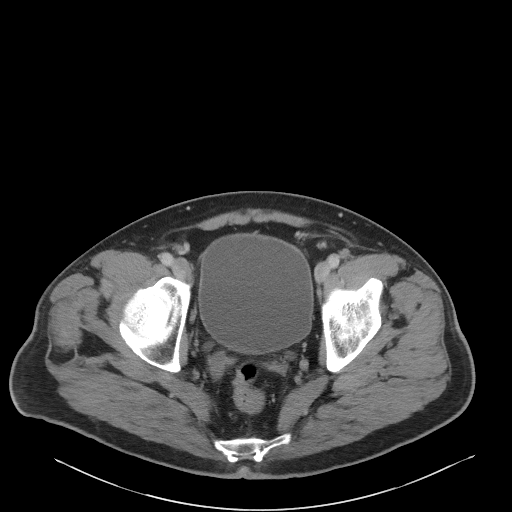
[im 27/95  soft-tissue]
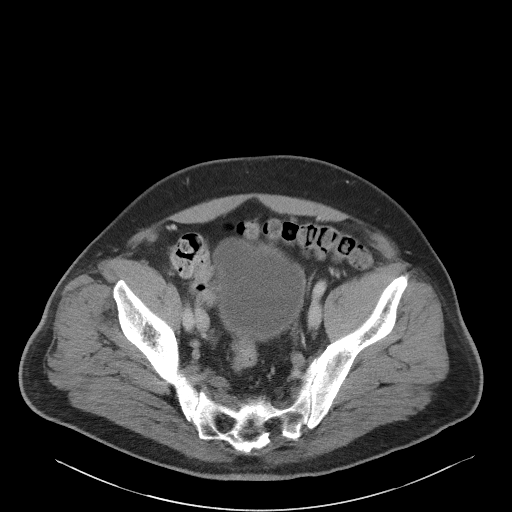
[im 32/95  soft-tissue]
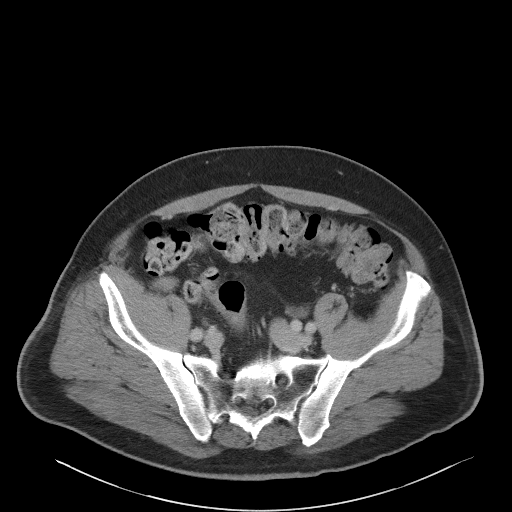
[im 37/95  soft-tissue]
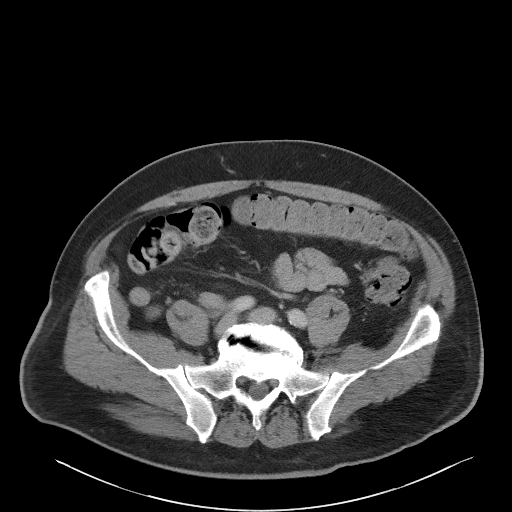
[im 42/95  soft-tissue]
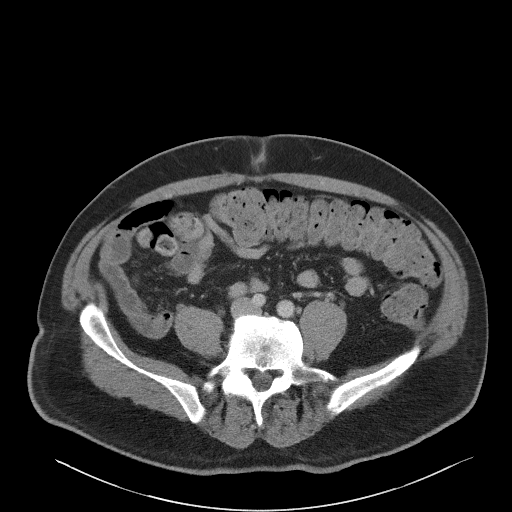
[im 53/95  soft-tissue]
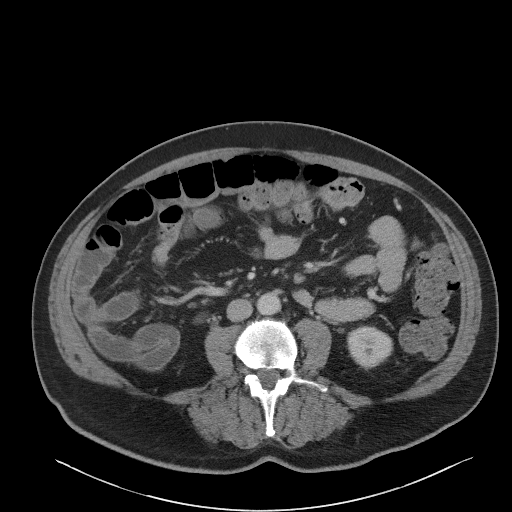
[im 58/95  soft-tissue]
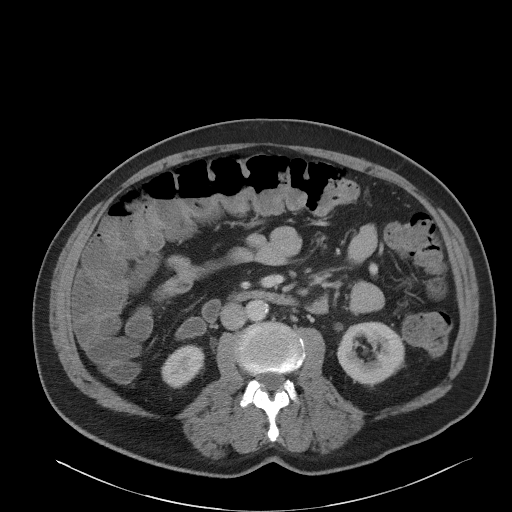
[im 58/95  bone]
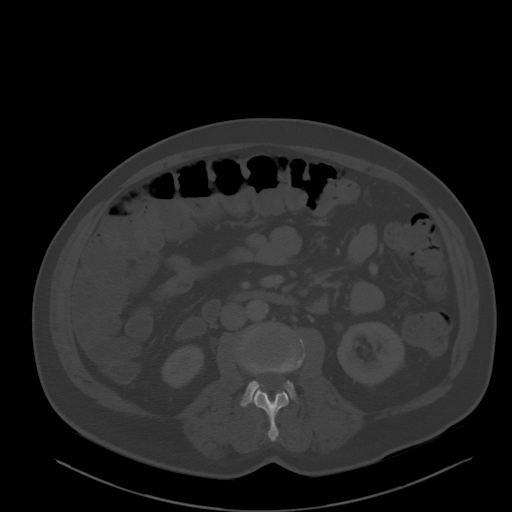
[im 63/95  soft-tissue]
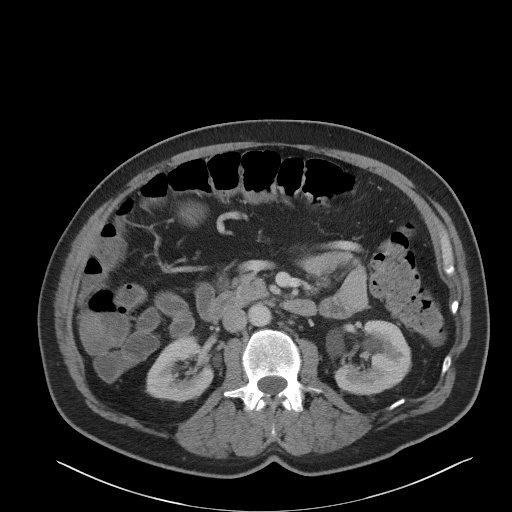
[im 68/95  soft-tissue]
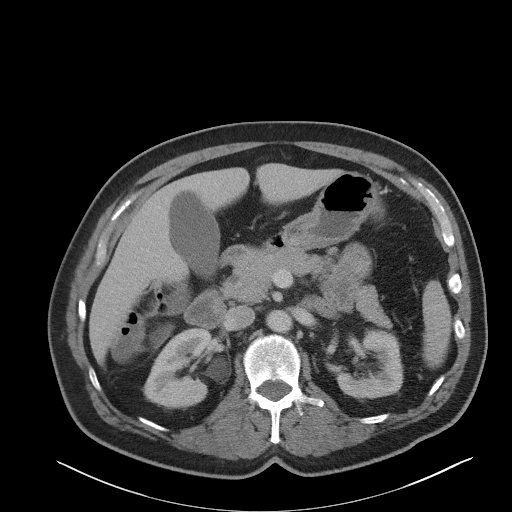
[im 74/95  soft-tissue]
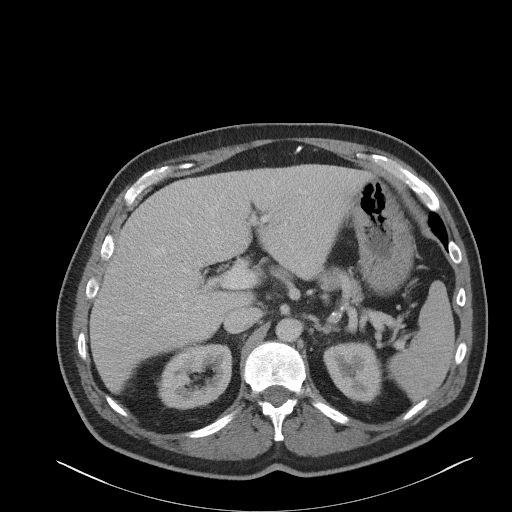
[im 84/95  soft-tissue]
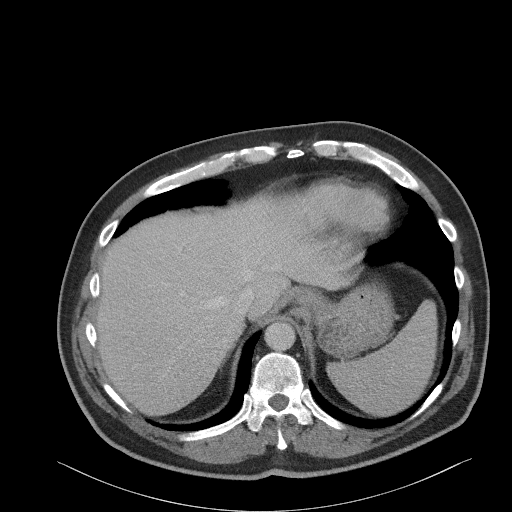
[im 89/95  soft-tissue]
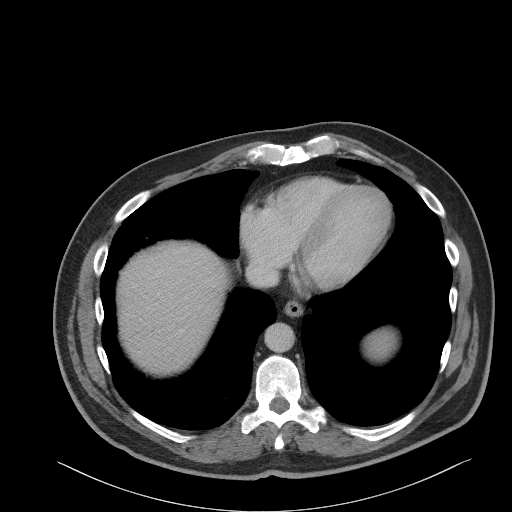

[Series 5: coronal st · coronal · 0.93mm/px · 3 of 102 slices shown]
[im 34/102  soft-tissue]
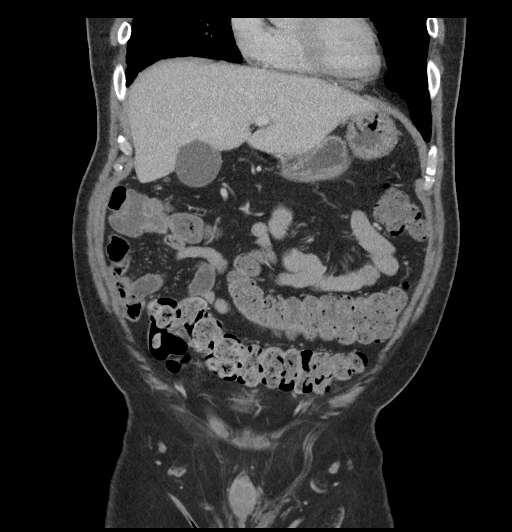
[im 45/102  soft-tissue]
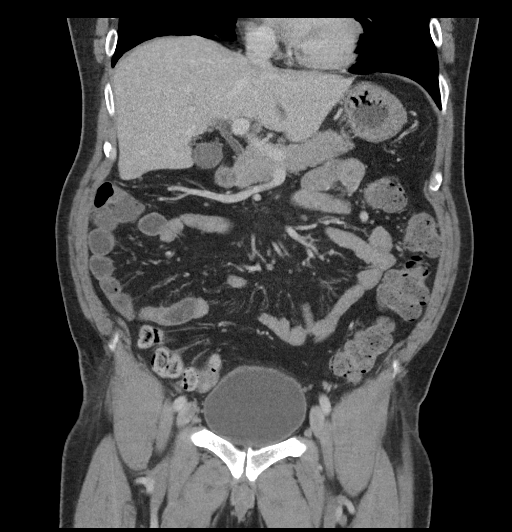
[im 57/102  soft-tissue]
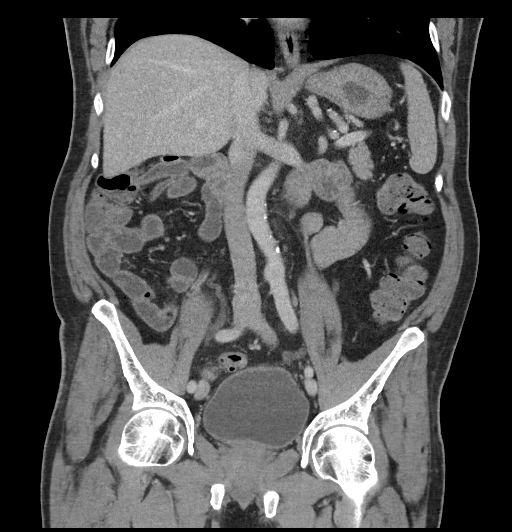

[17 of 46 positions shown; findings below may reference images not displayed]

FINDINGS: Lower chest: The lung bases are clear.

Hepatobiliary: No focal liver abnormality is seen. No gallstones,
gallbladder wall thickening, or biliary dilatation.

Pancreas: Unremarkable. No pancreatic ductal dilatation or
surrounding inflammatory changes.

Spleen: Normal in size without focal abnormality.

Adrenals/Urinary Tract: Adrenal glands are unremarkable. Kidneys are
normal, without renal calculi, focal lesion, or hydronephrosis.
Bladder is unremarkable.

Stomach/Bowel: Stomach is decompressed. Colon is diffusely
stool-filled. Fluid-filled nondistended small bowel. No wall
thickening or inflammatory changes appreciated. Appendix is normal.

Vascular/Lymphatic: Aortic atherosclerosis. No enlarged abdominal or
pelvic lymph nodes.

Reproductive: Prostate is unremarkable.

Other: Small left inguinal hernia containing fat. No free air or
free fluid in the abdomen.

Musculoskeletal: No destructive bone lesions. Degenerative changes
in the spine.
IMPRESSION: 1. Diffusely stool-filled colon consistent with constipation. No
evidence of bowel obstruction or inflammation.
2. Small left inguinal hernia containing fat.
3. Aortic atherosclerosis.

Aortic Atherosclerosis ([id number].A).

## 2024-05-09 ENCOUNTER — Encounter: Payer: Self-pay | Admitting: Orthopedic Surgery

## 2024-05-09 ENCOUNTER — Other Ambulatory Visit: Payer: Self-pay

## 2024-05-09 ENCOUNTER — Ambulatory Visit: Admitting: Orthopedic Surgery

## 2024-05-09 ENCOUNTER — Other Ambulatory Visit (INDEPENDENT_AMBULATORY_CARE_PROVIDER_SITE_OTHER)

## 2024-05-09 VITALS — BP 119/60 | Ht 74.0 in | Wt 216.0 lb

## 2024-05-09 DIAGNOSIS — G8929 Other chronic pain: Secondary | ICD-10-CM

## 2024-05-09 DIAGNOSIS — M1712 Unilateral primary osteoarthritis, left knee: Secondary | ICD-10-CM | POA: Diagnosis not present

## 2024-05-09 DIAGNOSIS — M25562 Pain in left knee: Secondary | ICD-10-CM | POA: Diagnosis not present

## 2024-05-09 DIAGNOSIS — M25561 Pain in right knee: Secondary | ICD-10-CM

## 2024-05-09 DIAGNOSIS — M23321 Other meniscus derangements, posterior horn of medial meniscus, right knee: Secondary | ICD-10-CM

## 2024-05-09 DIAGNOSIS — M51369 Other intervertebral disc degeneration, lumbar region without mention of lumbar back pain or lower extremity pain: Secondary | ICD-10-CM | POA: Insufficient documentation

## 2024-05-09 NOTE — Progress Notes (Signed)
 Patient ID: Sean Buckley, male   DOB: 1955-12-27, 68 y.o.   MRN: [id number]  Chief Complaint  Patient presents with   Knee Pain    Both     Current Outpatient Medications  Medication Instructions   ALPRAZolam (XANAX) 1 MG tablet TAKE 1/2 TAB(S) AT BEDTIME AS NEEDED ORALLY 60 DAYS   atorvastatin (LIPITOR) 40 MG tablet TAKE 1 TABLET BY MOUTH EVERYDAY AT BEDTIME   buPROPion (WELLBUTRIN XL) 150 MG 24 hr tablet TAKE 2 TAB(S) EVERY MORNING 1 TAB EVERY EVENING 2 TIMES DAILY ORALLY 90 DAYS   canagliflozin (INVOKANA) 100 MG TABS tablet 1 tab(s) orally once a day; Duration: 90 days   fenofibrate (TRICOR) 145 mg, Daily   FLUoxetine (PROZAC) 20 MG capsule 1 cap(s) orally once a day; Duration: 90   metFORMIN (GLUCOPHAGE) 500 mg, Oral, 2 times daily with meals    Allergies  Allergen Reactions   Pollen Extract Other (See Comments)    .   Wheat Other (See Comments)    .     68 year old male with bilateral knee pain presents for evaluation and management.  The patient gives a history of pain in his left  knee for 40 years status post arthroscopic surgery for torn meniscus and ACL tear with meniscal debridement and rehab and pain in his right knee since the second week of August when he was coming down the stairs and it popped.  He felt severe medial knee pain  He took Advil and Tylenol without relief  He is diabetic  Knee Pain     ROS no history of chest pain or shortness of breath  Past Medical History:  Diagnosis Date   Diabetes mellitus without complication (HCC)    Hyperlipidemia    Hypertension     Past Surgical History:  Procedure Laterality Date   KNEE SURGERY Left    ACL tear meniscal tear, partial meniscectomy no ACL reconstruction    PHYSICAL EXAM  BP 119/60 Comment: 04/16/24  Ht 6' 2 (1.88 m)   Wt 216 lb (98 kg)   BMI 27.73 kg/m  GENERAL appearance reveals no gross abnormalities, normal development grooming and hygiene   MENTAL STATUS we note that the patient  is awake alert and oriented to person place and time MOOD/AFFECT ARE NORMAL   GAIT reveals no limp in the effected limb  Ortho Exam  Painless range of motion bilateral hips  Left knee firm endpoint but 1+ Lachman test No other abnormalities, no effusion normal range of motion  Right knee medial joint line tenderness no effusion Pain with hyperflexion Positive Apley Positive McMurray's sign Normal range of motion Normal strength without instability  VASC 2+ dorsalis pedis pulse normal capillary refill excellent warmth to the extremity  NEURO normal sensation and no pathologic reflexes  LYMPH deferred noncontributory  MEDICAL DECISION MAKING  DG Knee AP/LAT W/Sunrise Right Result Date: 05/09/2024 Report of image right and left knee On the AP views of the right and left knee we see hypertrophy of the medial femoral condyle of the left knee with mild lateral compartment changes, small osteophytes in the peripatellar region on the axial view Tibiofemoral angle valgus Primarily lateral compartment arthrosis On the right side Tibiofemoral valgus as well with no significant changes to suggest joint space narrowing or osteophyte formation normal patellofemoral height and alignment Impression osteoarthritis left knee with osteophytes without major joint space narrowing Normal right knee without osteoarthritis   DG Knee AP/LAT W/Sunrise Left Result Date: 05/09/2024 Report of  image right and left knee On the AP views of the right and left knee we see hypertrophy of the medial femoral condyle of the left knee with mild lateral compartment changes, small osteophytes in the peripatellar region on the axial view Tibiofemoral angle valgus Primarily lateral compartment arthrosis On the right side Tibiofemoral valgus as well with no significant changes to suggest joint space narrowing or osteophyte formation normal patellofemoral height and alignment Impression osteoarthritis left knee with osteophytes  without major joint space narrowing Normal right knee without osteoarthritis    No orders of the defined types were placed in this encounter.    Assessment and plan  68 year old male with osteoarthritis left knee after torn ACL and partial medial meniscectomy 40 years ago which is currently asymptomatic  Acute tear medial meniscus recommend MRI to rule out meniscal tear and plan for surgery  Patient will return after MRI to discuss surgery

## 2024-05-09 NOTE — Progress Notes (Signed)
  Intake history:  Chief Complaint  Patient presents with   Knee Pain    Both      BP 119/60 Comment: 04/16/24  Ht 6' 2 (1.88 m)   Wt 216 lb (98 kg)   BMI 27.73 kg/m  Body mass index is 27.73 kg/m.  Pharmacy? _____[facility] _________________________________  WHAT ARE WE SEEING YOU FOR TODAY?   Knee pain both   How long has this bothered you? (DOI?DOS?WS?) Left 40 years Right since Aug,  2nd week was walking down stairs popped  Was there an injury?   Anticoag.  No  Diabetes Yes  Heart disease No  Hypertension No  SMOKING HX No  Kidney disease No  Any ALLERGIES ____________ Allergies  Allergen Reactions   Pollen Extract Other (See Comments)    .   Wheat Other (See Comments)    .   __________________________________   Treatment:  Have you taken:  Tylenol Yes  Advil Yes  Had PT No  Had injection No  Other  ___________left knee surgery Arthroscopic ______________

## 2024-05-09 NOTE — Patient Instructions (Signed)
 While we are working on your approval for MRI please go ahead and call to schedule your appointment with [facility] scheduling Imaging within at least one (1) week.   Central Scheduling [phone]

## 2024-05-10 ENCOUNTER — Ambulatory Visit (HOSPITAL_COMMUNITY)
Admission: RE | Admit: 2024-05-10 | Discharge: 2024-05-10 | Disposition: A | Discharge status: Home / Self Care | Source: Ambulatory Visit | Attending: Orthopedic Surgery | Admitting: Orthopedic Surgery

## 2024-05-10 DIAGNOSIS — M23321 Other meniscus derangements, posterior horn of medial meniscus, right knee: Secondary | ICD-10-CM | POA: Insufficient documentation

## 2024-05-19 ENCOUNTER — Ambulatory Visit: Admitting: Orthopedic Surgery

## 2024-05-19 DIAGNOSIS — M1712 Unilateral primary osteoarthritis, left knee: Secondary | ICD-10-CM

## 2024-05-19 DIAGNOSIS — G8929 Other chronic pain: Secondary | ICD-10-CM

## 2024-05-19 DIAGNOSIS — Z01818 Encounter for other preprocedural examination: Secondary | ICD-10-CM

## 2024-05-19 DIAGNOSIS — M23321 Other meniscus derangements, posterior horn of medial meniscus, right knee: Secondary | ICD-10-CM | POA: Diagnosis not present

## 2024-05-19 NOTE — Progress Notes (Signed)
 Patient ID: Sean Buckley, male   DOB: [birth year], 68 y.o.   MRN: [id number]       Chief Complaint  Patient presents with   Knee Pain      Bilateral knee pain right worse than left          Current Outpatient Medications  Medication Instructions   ALPRAZolam (XANAX) 1 MG tablet TAKE 1/2 TAB(S) AT BEDTIME AS NEEDED ORALLY 60 DAYS   atorvastatin (LIPITOR) 40 MG tablet TAKE 1 TABLET BY MOUTH EVERYDAY AT BEDTIME   buPROPion (WELLBUTRIN XL) 150 MG 24 hr tablet TAKE 2 TAB(S) EVERY MORNING 1 TAB EVERY EVENING 2 TIMES DAILY ORALLY 90 DAYS   canagliflozin (INVOKANA) 100 MG TABS tablet 1 tab(s) orally once a day; Duration: 90 days   fenofibrate (TRICOR) 145 mg, Daily   FLUoxetine (PROZAC) 20 MG capsule 1 cap(s) orally once a day; Duration: 90   metFORMIN (GLUCOPHAGE) 500 mg, Oral, 2 times daily with meals      Allergies       Allergies  Allergen Reactions   Pollen Extract Other (See Comments)      .   Wheat Other (See Comments)      .          68 year old male with bilateral knee pain initially presented for evaluation and management of bilateral knee pain right worse than left  In the second week of August the patient was coming down some stairs the knee popped he felt acute medial burning pain presents for evaluation and management.    The patient gives a history of pain in his left  knee for 40 years status post arthroscopic surgery for torn meniscus and ACL tear with meniscal debridement and rehab and pain in his right knee    He took Advil and Tylenol without relief   He is diabetic  After several months and treatment he has not improved significantly enough to keep him from waking up in the middle of the night and being uncomfortable and has had to modify his activities.  He wishes to continue flyfishing if possible for the next 4 to 5 years        ROS no history of chest pain or shortness of breath       Past Medical History:  Diagnosis Date   Diabetes mellitus  without complication (HCC)     Hyperlipidemia     Hypertension                 Past Surgical History:  Procedure Laterality Date   KNEE SURGERY Left      ACL tear meniscal tear, partial meniscectomy no ACL reconstruction          Family History  Problem Relation Age of Onset   Diabetes Other    Cancer Other    Rheum arthritis Other     Social History   Tobacco Use   Smoking status: Never   Smokeless tobacco: Never  Vaping Use   Vaping status: Never Used  Substance Use Topics   Alcohol use: Yes    Comment: 10 oz per week    Drug use: Never     PHYSICAL EXAM  BP 119/60 Comment: 04/16/24  Ht 6' 2 (1.88 m)   Wt 216 lb (98 kg)   BMI 27.73 kg/m  GENERAL appearance reveals no gross abnormalities, normal development grooming and hygiene    MENTAL STATUS we note that the patient is awake alert and oriented to person place  and time MOOD/AFFECT ARE NORMAL    GAIT reveals no limp in the effected limb   Ortho Exam   Painless range of motion bilateral hips   Left knee firm endpoint but 1+ Lachman test No other abnormalities, no effusion normal range of motion   Right knee medial joint line tenderness no effusion Pain with hyperflexion Positive Apley Positive McMurray's sign Normal range of motion Normal strength without instability   VASC 2+ dorsalis pedis pulse normal capillary refill excellent warmth to the extremity   NEURO normal sensation and no pathologic reflexes   LYMPH deferred noncontributory   MEDICAL DECISION MAKING   DG Knee AP/LAT W/Sunrise Right Result Date: 05/09/2024 Report of image right and left knee On the AP views of the right and left knee we see hypertrophy of the medial femoral condyle of the left knee with mild lateral compartment changes, small osteophytes in the peripatellar region on the axial view Tibiofemoral angle valgus Primarily lateral compartment arthrosis On the right side Tibiofemoral valgus as well with no significant  changes to suggest joint space narrowing or osteophyte formation normal patellofemoral height and alignment Impression osteoarthritis left knee with osteophytes without major joint space narrowing Normal right knee without osteoarthritis    DG Knee AP/LAT W/Sunrise Left Result Date: 05/09/2024 Report of image right and left knee On the AP views of the right and left knee we see hypertrophy of the medial femoral condyle of the left knee with mild lateral compartment changes, small osteophytes in the peripatellar region on the axial view Tibiofemoral angle valgus Primarily lateral compartment arthrosis On the right side Tibiofemoral valgus as well with no significant changes to suggest joint space narrowing or osteophyte formation normal patellofemoral height and alignment Impression osteoarthritis left knee with osteophytes without major joint space narrowing Normal right knee without osteoarthritis    No orders of the defined types were placed in this encounter.     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

## 2024-05-19 NOTE — Progress Notes (Signed)
   Patient: Sean Buckley           Date of Birth: May 11, 1956           MRN: [id number] Visit Date: 05/19/2024 Requested by: Catalina Bare, MD [address] [place],  [place] [address] PCP: Catalina Bare, MD  Encounter Diagnoses  Name Primary?   Derangement of posterior horn of medial meniscus of right knee Yes   Bilateral chronic knee pain    Primary osteoarthritis of left knee     Assessment and plan:  After a discussion on the risks and benefits of our surgical arthroscopy of the knee and meniscectomy versus nonoperative treatment the patient indicates that he is having enough pain and discomfort in his knee that he would like to proceed with surgical intervention  The procedure has been fully reviewed with the patient; The risks and benefits of surgery have been discussed and explained and understood. Alternative treatment has also been reviewed, questions were encouraged and answered. The postoperative plan is also been reviewed.  There is some chondromalacia of the medial femoral condyle which puts this knee at risk for arthritis  No orders of the defined types were placed in this encounter.    Chief Complaint  Patient presents with   Results    R knee MRI Results    History:  Acute injury right knee burning pain medial side treated with nonoperative care did not improve went for MRI MRI shows medial meniscus tear  Focused exam findings:  Previous visits indicated tenderness over the medial joint line consistent with torn medial meniscus  No results found.     05/19/2024   Chief Complaint  Patient presents with   Results    R knee MRI Results    Encounter Diagnoses  Name Primary?   Derangement of posterior horn of medial meniscus of right knee Yes   Bilateral chronic knee pain    Primary osteoarthritis of left knee     What pharmacy do you use ? [facility]  Injury date second week of August  Did you get better, worse or  no change (Answer below)   Unchanged  X-ray May 09, 2024  On the right side Tibiofemoral valgus as well with no significant changes to suggest joint space narrowing or osteophyte formation normal patellofemoral height and alignment Impression osteoarthritis left knee with osteophytes without major joint space narrowing   Normal right knee without osteoarthritis   MRI results IMPRESSION: Radial tear to the posterior horn of the medial meniscus near the apex.   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   Small joint effusion.     Electronically signed by: Reyes Frees MD 05/10/2024 09:24 PM EDT RP Workstation: [id number]  Full history and physical be dictated for surgery

## 2024-05-19 NOTE — Progress Notes (Signed)
    05/19/2024   Chief Complaint  Patient presents with   Results    R knee MRI Results    Encounter Diagnoses  Name Primary?   Derangement of posterior horn of medial meniscus of right knee Yes   Bilateral chronic knee pain    Primary osteoarthritis of left knee     What pharmacy do you use ? [facility]  Injury date second week of August  Did you get better, worse or no change (Answer below)   Unchanged  X-ray May 09, 2024  On the right side Tibiofemoral valgus as well with no significant changes to suggest joint space narrowing or osteophyte formation normal patellofemoral height and alignment Impression osteoarthritis left knee with osteophytes without major joint space narrowing   Normal right knee without osteoarthritis

## 2024-05-19 NOTE — Patient Instructions (Signed)
 Your surgery will be at [facility] by Dr Margrette  The hospital will contact you with a preoperative appointment to discuss Anesthesia.  Please arrive on time or 15 minutes early for the preoperative appointment, they have a very tight schedule if you are late or do not come in your surgery will be cancelled.  The phone number is [phone]. Please bring your medications with you for the appointment. They will tell you the arrival time and medication instructions when you have your preoperative evaluation. Do not wear nail polish the day of your surgery and if you take Phentermine you need to stop this medication ONE WEEK prior to your surgery. If you take Invokana, Farxiga, Jardiance, or Steglatro) - Hold 72 hours before the procedure.  If you take Ozempic,  Mounjaro, Bydureon or Trulicity do not take for 8 days before your surgery. If you take Victoza, Rybelsis, Saxenda or Adlyxi stop 24 hours before the procedure.  Please arrive at the hospital 2 hours before procedure if scheduled at 9:30 or later in the day or at the time the nurse tells you at your preoperative visit.   If you have my chart do not use the time given in my chart use the time given to you by the nurse during your preoperative visit.   Your surgery  time may change. Please be available for phone calls the day of your surgery and the day before. The Short Stay department may need to discuss changes about your surgery time. Not reaching the you could lead to procedure delays and possible cancellation.  You must have a ride home and someone to stay with you for 24 to 48 hours. The person taking you home will receive and sign for the your discharge instructions.  Please be prepared to give your support person's name and telephone number to Central Registration. Dr Margrette will need that name and phone number post procedure.

## 2024-05-19 NOTE — Addendum Note (Signed)
 Addended byBETHA JENEAN GREIG LELON on: 05/19/2024 02:22 PM   Modules accepted: Orders

## 2024-06-02 NOTE — Patient Instructions (Signed)
 Sean Buckley  06/02/2024     @PREFPERIOPPHARMACY @   Your procedure is scheduled on 06/07/2024.   Report to [facility] at  0815 A.M.   Call this number if you have problems the morning of surgery:  [phone]  If you experience any cold or flu symptoms such as cough, fever, chills, shortness of breath, etc. between now and your scheduled surgery, please notify us  at the above number.   Remember:        DO NOT take any medications for diabetes the morning of your procedure.   Do not eat after midnight.   You may drink clear liquids until 0615 am on 06/07/2024.      Clear liquids allowed are:                    Water, Carbonated beverages (diabetics please choose diet or no sugar options), Black Coffee Only (No creamer, milk or cream, including half & half and powdered creamer), and Clear Sports drink (No red color; diabetics please choose diet or no sugar options)    Take these medicines the morning of surgery with A SIP OF WATER                                     bupropion and fluoxetine.    Do not wear jewelry, make-up or nail polish, including gel polish,  artificial nails, or any other type of covering on natural nails (fingers and  toes).  Do not wear lotions, powders, or perfumes, or deodorant.  Do not shave 48 hours prior to surgery.  Men may shave face and neck.  Do not bring valuables to the hospital.  [facility] is not responsible for any belongings or valuables.  Contacts, dentures or bridgework may not be worn into surgery.  Leave your suitcase in the car.  After surgery it may be brought to your room.  For patients admitted to the hospital, discharge time will be determined by your treatment team.  Patients discharged the day of surgery will not be allowed to drive home and must have someone with them for 24 hours.    Special instructions:  DO NOT smoke tobacco or vape fopr 24 hours before your procedure.  Please read over the following  fact sheets that you were given. Coughing and Deep Breathing, Surgical Site Infection Prevention, Anesthesia Post-op Instructions, and Care and Recovery After Surgery      Arthroscopic Knee Ligament Repair, Care After After your knee surgery, it's common to have soreness, swelling, or pain. You may also have some fluid coming from the cuts that were made on your knee (incisions). Follow these instructions at home: Medicines Take over-the-counter and prescription medicines only as told by your health care provider. Ask your provider if the medicine prescribed to you: Requires you to avoid driving or using machinery. Can cause constipation. You may need to take these actions to prevent or treat constipation: Drink enough fluid to keep your pee (urine) pale yellow. Take over-the-counter or prescription medicines. Eat foods that are high in fiber, such as beans, whole grains, and fresh fruits and vegetables. Limit foods that are high in fat and processed sugars, such as fried or sweet foods. If you have a brace: Wear the brace as told by your provider. Remove it only as told by your provider. Check the skin around the  brace every day. Tell your provider about any concerns. Loosen the brace if your toes tingle, become numb, or turn cold and blue. Keep it clean and dry. Ask your provider when it's safe to drive if you have a brace on your knee. Bathing Do not take baths, swim, or use a hot tub until your provider approves. Keep your bandage dry until your provider says that it can be removed. If the brace is not waterproof: Do not let it get wet. Cover it with a watertight covering when you take a bath or shower. Incision care  Follow instructions from your provider about how to take care of your incisions. Make sure you: Wash your hands with soap and water for at least 20 seconds before and after you change your dressing. If soap and water are not available, use hand sanitizer. Change  your dressing as told by your provider. Leave stitches, skin glue, or tape strips in place. These skin closures may need to stay in place for 2 weeks or longer. If tape strip edges start to loosen and curl up, you may trim the loose edges. Do not remove tape strips completely unless your provider tells you to do that. Check your incision areas every day for signs of infection. Check for: Redness. More swelling or pain. Blood or more fluid. Warmth. Pus or a bad smell. Managing pain, stiffness, and swelling  If told, put ice on the affected area. If you have a removable brace, remove it as told by your provider. Put ice in a plastic bag. Place a towel between your skin and the bag. Leave the ice on for 20 minutes, 2-3 times a day. If your skin turns bright red, remove the ice right away to prevent skin damage. The risk of damage is higher if you can't feel pain, heat, or cold. Move your toes often to reduce stiffness and swelling. Raise the injured area above the level of your heart while you are sitting or lying down. Activity Do not use your knee to walk until your provider says that you can. Use crutches or other devices as told by your provider. You will work with a physical therapist to do exercises. This will make your knee move better and get stronger. Your provider will tell you: When you may do exercises that move parts of your body. These are called motion exercises. When you may start to ride a stationary bike and other gentle exercises. When you may start to do harder exercises, such as jogging. You may have to avoid lifting. Ask your provider how much you can safely lift. Return to your normal activities as told by your provider. Ask your provider what activities are safe for you. General instructions Do not use any products that contain nicotine or tobacco. These products include cigarettes, chewing tobacco, and vaping devices, such as e-cigarettes. If you need help quitting,  ask your provider. Wear compression stockings as told by your provider. These stockings help to prevent blood clots and reduce swelling in your leg. Keep all follow-up visits. Your provider will check that your knee is healing well. Contact a health care provider if: You have signs of infection in the cuts that were made on your knee. Signs include swelling, redness, warmth, pus, or a bad smell. You have a fever or chills. You have pain that does not get better with medicine. The cuts in your knee open up. Get help right away if: You have trouble breathing. You have chest pain.  You have worse pain or swelling in your calf or at the back of your knee. Your knee, foot, ankle, or toes tingle or become numb. Your foot or toes look darker than normal or are cooler than normal. These symptoms may be an emergency. Get help right away. Call 911. Do not wait to see if the symptoms will go away. Do not drive yourself to the hospital. This information is not intended to replace advice given to you by your health care provider. Make sure you discuss any questions you have with your health care provider. Document Revised: 10/01/2022 Document Reviewed: 10/01/2022 Elsevier Patient Education  2024 [employer]General Anesthesia, Adult, Care After The following information offers guidance on how to care for yourself after your procedure. Your health care provider may also give you more specific instructions. If you have problems or questions, contact your health care provider. What can I expect after the procedure? After the procedure, it is common for people to: Have pain or discomfort at the IV site. Have nausea or vomiting. Have a sore throat or hoarseness. Have trouble concentrating. Feel cold or chills. Feel weak, sleepy, or tired (fatigue). Have soreness and body aches. These can affect parts of the body that were not involved in surgery. Follow these instructions at home: For the time period  you were told by your health care provider:  Rest. Do not participate in activities where you could fall or become injured. Do not drive or use machinery. Do not drink alcohol. Do not take sleeping pills or medicines that cause drowsiness. Do not make important decisions or sign legal documents. Do not take care of children on your own. General instructions Drink enough fluid to keep your urine pale yellow. If you have sleep apnea, surgery and certain medicines can increase your risk for breathing problems. Follow instructions from your health care provider about wearing your sleep device: Anytime you are sleeping, including during daytime naps. While taking prescription pain medicines, sleeping medicines, or medicines that make you drowsy. Return to your normal activities as told by your health care provider. Ask your health care provider what activities are safe for you. Take over-the-counter and prescription medicines only as told by your health care provider. Do not use any products that contain nicotine or tobacco. These products include cigarettes, chewing tobacco, and vaping devices, such as e-cigarettes. These can delay incision healing after surgery. If you need help quitting, ask your health care provider. Contact a health care provider if: You have nausea or vomiting that does not get better with medicine. You vomit every time you eat or drink. You have pain that does not get better with medicine. You cannot urinate or have bloody urine. You develop a skin rash. You have a fever. Get help right away if: You have trouble breathing. You have chest pain. You vomit blood. These symptoms may be an emergency. Get help right away. Call 911. Do not wait to see if the symptoms will go away. Do not drive yourself to the hospital. Summary After the procedure, it is common to have a sore throat, hoarseness, nausea, vomiting, or to feel weak, sleepy, or fatigue. For the time period you  were told by your health care provider, do not drive or use machinery. Get help right away if you have difficulty breathing, have chest pain, or vomit blood. These symptoms may be an emergency. This information is not intended to replace advice given to you by your health care provider. Make sure you  discuss any questions you have with your health care provider. Document Revised: 10/04/2021 Document Reviewed: 10/04/2021 Elsevier Patient Education  2024 [employer] Inc[employer]  How to Use Chlorhexidine at Home in the Shower Chlorhexidine gluconate (CHG) is a germ-killing (antiseptic) wash that's used to clean the skin. It can get rid of the germs that normally live on the skin and can keep them away for about 24 hours. If you're having surgery, you may be told to shower with CHG at home the night before surgery. This can help lower your risk for infection. To use CHG wash in the shower, follow the steps below. Supplies needed: CHG body wash. Clean washcloth. Clean towel. How to use CHG in the shower Follow these steps unless you're told to use CHG in a different way: Start the shower. Use your normal soap and shampoo to wash your face and hair. Turn off the shower or move out of the shower stream. Pour CHG onto a clean washcloth. Do not use any type of brush or rough sponge. Start at your neck, washing your body down to your toes. Make sure you: Wash the part of your body where the surgery will be done for at least 1 minute. Do not scrub. Do not use CHG on your head or face unless your health care provider tells you to. If it gets into your ears or eyes, rinse them well with water. Do not wash your genitals with CHG. Wash your back and under your arms. Make sure to wash skin folds. Let the CHG sit on your skin for 1-2 minutes or as long as told. Rinse your entire body in the shower, including all body creases and folds. Turn off the shower. Dry off with a clean towel. Do not put anything on your  skin afterward, such as powder, lotion, or perfume. Put on clean clothes or pajamas. If it's the night before surgery, sleep in clean sheets. General tips Use CHG only as told, and follow the instructions on the label. Use the full amount of CHG as told. This is often one bottle. Do not smoke and stay away from flames after using CHG. Your skin may feel sticky after using CHG. This is normal. The sticky feeling will go away as the CHG dries. Do not use CHG: If you have a chlorhexidine allergy or have reacted to chlorhexidine in the past. On open wounds or areas of skin that have broken skin, cuts, or scrapes. On babies younger than 78 months of age. Contact a health care provider if: You have questions about using CHG. Your skin gets irritated or itchy. You have a rash after using CHG. You swallow any CHG. Call your local poison control center [phone] in the [place] Your eyes itch badly, or they become very red or swollen. Your hearing changes. You have trouble seeing. If you can't reach your provider, go to an urgent care or emergency room. Do not drive yourself. Get help right away if: You have swelling or tingling in your mouth or throat. You make high-pitched whistling sounds when you breathe, most often when you breathe out (wheeze). You have trouble breathing. These symptoms may be an emergency. Call 911 right away. Do not wait to see if the symptoms will go away. Do not drive yourself to the hospital. This information is not intended to replace advice given to you by your health care provider. Make sure you discuss any questions you have with your health care provider. Document Revised: 01/20/2023 Document Reviewed:  01/16/2022 Elsevier Patient Education  2024 Else[employer] Inc.How to Use an Facilities Manager An incentive spirometer is a tool that measures how well you are filling your lungs with each breath. Learning to take long, deep breaths using this tool can help you  keep your lungs clear and active. This may help to reverse or lessen your chance of developing breathing (pulmonary) problems, especially infection. You may be asked to use a spirometer: After a surgery. If you have a lung problem or a history of smoking. After a long period of time when you have been unable to move or be active. If the spirometer includes an indicator to show the highest number that you have reached, your health care provider or respiratory therapist will help you set a goal. Keep a log of your progress as told by your health care provider. What are the risks? Breathing too quickly may cause dizziness or cause you to pass out. Take your time so you do not get dizzy or light-headed. If you are in pain, you may need to take pain medicine before doing incentive spirometry. It is harder to take a deep breath if you are having pain. How to use your incentive spirometer  Sit up on the edge of your bed or on a chair. Hold the incentive spirometer so that it is in an upright position. Before you use the spirometer, breathe out normally. Place the mouthpiece in your mouth. Make sure your lips are closed tightly around it. Breathe in slowly and as deeply as you can through your mouth, causing the piston or the ball to rise toward the top of the chamber. Hold your breath for 3-5 seconds, or for as long as possible. If the spirometer includes a coach indicator, use this to guide you in breathing. Slow down your breathing if the indicator goes above the marked areas. Remove the mouthpiece from your mouth and breathe out normally. The piston or ball will return to the bottom of the chamber. Rest for a few seconds, then repeat the steps 10 or more times. Take your time and take a few normal breaths between deep breaths so that you do not get dizzy or light-headed. Do this every 1-2 hours when you are awake. If the spirometer includes a goal marker to show the highest number you have reached  (best effort), use this as a goal to work toward during each repetition. After each set of 10 deep breaths, cough a few times. This will help to make sure that your lungs are clear. If you have an incision on your chest or abdomen from surgery, place a pillow or a rolled-up towel firmly against the incision when you cough. This can help to reduce pain while taking deep breaths and coughing. General tips When you are able to get out of bed: Walk around often. Continue to take deep breaths and cough in order to clear your lungs. Keep using the incentive spirometer until your health care provider says it is okay to stop using it. If you have been in the hospital, you may be told to keep using the spirometer at home. Contact a health care provider if: You are having difficulty using the spirometer. You have trouble using the spirometer as often as instructed. Your pain medicine is not giving enough relief for you to use the spirometer as told. You have a fever. Get help right away if: You develop shortness of breath. You develop a cough with bloody mucus from the lungs. You  have fluid or blood coming from an incision site after you cough. Summary An incentive spirometer is a tool that can help you learn to take long, deep breaths to keep your lungs clear and active. You may be asked to use a spirometer after a surgery, if you have a lung problem or a history of smoking, or if you have been inactive for a long period of time. Use your incentive spirometer as instructed every 1-2 hours while you are awake. If you have an incision on your chest or abdomen, place a pillow or a rolled-up towel firmly against your incision when you cough. This will help to reduce pain. Get help right away if you have shortness of breath, you cough up bloody mucus, or blood comes from your incision when you cough. This information is not intended to replace advice given to you by your health care provider. Make sure you  discuss any questions you have with your health care provider. Document Revised: 05/15/2023 Document Reviewed: 05/15/2023 Elsevier Patient Education  2024 [employer].

## 2024-06-03 ENCOUNTER — Encounter (HOSPITAL_COMMUNITY): Payer: Self-pay

## 2024-06-03 ENCOUNTER — Encounter (HOSPITAL_COMMUNITY)
Admission: RE | Admit: 2024-06-03 | Discharge: 2024-06-03 | Disposition: A | Discharge status: Home / Self Care | Source: Ambulatory Visit | Attending: Orthopedic Surgery | Admitting: Orthopedic Surgery

## 2024-06-03 ENCOUNTER — Other Ambulatory Visit: Payer: Self-pay

## 2024-06-03 VITALS — BP 147/78 | HR 75 | Temp 98.2°F | Resp 16 | Ht 74.0 in | Wt 211.0 lb

## 2024-06-03 DIAGNOSIS — M23321 Other meniscus derangements, posterior horn of medial meniscus, right knee: Secondary | ICD-10-CM | POA: Diagnosis not present

## 2024-06-03 DIAGNOSIS — E119 Type 2 diabetes mellitus without complications: Secondary | ICD-10-CM | POA: Diagnosis not present

## 2024-06-03 DIAGNOSIS — Z01818 Encounter for other preprocedural examination: Secondary | ICD-10-CM | POA: Insufficient documentation

## 2024-06-03 DIAGNOSIS — I10 Essential (primary) hypertension: Secondary | ICD-10-CM | POA: Diagnosis not present

## 2024-06-03 LAB — CBC WITH DIFFERENTIAL/PLATELET
Abs Immature Granulocytes: 0.03 K/uL (ref 0.00–0.07)
Basophils Absolute: 0.1 K/uL (ref 0.0–0.1)
Basophils Relative: 1 %
Eosinophils Absolute: 0.2 K/uL (ref 0.0–0.5)
Eosinophils Relative: 3 %
HCT: 37.3 % — ABNORMAL LOW (ref 39.0–52.0)
Hemoglobin: 12.9 g/dL — ABNORMAL LOW (ref 13.0–17.0)
Immature Granulocytes: 1 %
Lymphocytes Relative: 19 %
Lymphs Abs: 1.2 K/uL (ref 0.7–4.0)
MCH: 31.2 pg (ref 26.0–34.0)
MCHC: 34.6 g/dL (ref 30.0–36.0)
MCV: 90.3 fL (ref 80.0–100.0)
Monocytes Absolute: 0.7 K/uL (ref 0.1–1.0)
Monocytes Relative: 11 %
Neutro Abs: 4.1 K/uL (ref 1.7–7.7)
Neutrophils Relative %: 65 %
Platelets: 301 K/uL (ref 150–400)
RBC: 4.13 MIL/uL — ABNORMAL LOW (ref 4.22–5.81)
RDW: 12.4 % (ref 11.5–15.5)
WBC: 6.2 K/uL (ref 4.0–10.5)
nRBC: 0 % (ref 0.0–0.2)

## 2024-06-03 LAB — BASIC METABOLIC PANEL WITH GFR
Anion gap: 12 (ref 5–15)
BUN: 19 mg/dL (ref 8–23)
CO2: 24 mmol/L (ref 22–32)
Calcium: 8 mg/dL — ABNORMAL LOW (ref 8.9–10.3)
Chloride: 102 mmol/L (ref 98–111)
Creatinine, Ser: 0.95 mg/dL (ref 0.61–1.24)
GFR, Estimated: >60 mL/min (ref >60)
Glucose, Bld: 206 mg/dL — ABNORMAL HIGH (ref 70–99)
Potassium: 4.3 mmol/L (ref 3.5–5.1)
Sodium: 139 mmol/L (ref 135–145)

## 2024-06-03 LAB — HEMOGLOBIN A1C
Hgb A1c MFr Bld: 6.9 % — ABNORMAL HIGH (ref 4.8–5.6)
Mean Plasma Glucose: 151.33 mg/dL

## 2024-06-06 NOTE — H&P (Signed)
 Chief complaint right knee pain 68 year old presented with pain in his right knee since the second week of August when he was coming down the stairs and it popped.  He felt severe medial knee pain.  We tried nonoperative treatment as noted below and he did not improve   He took Advil and Tylenol without relief   He is diabetic  MRI showed a torn medial meniscus with some arthritis  After consideration and discussion of treatment options the patient opted for arthroscopic surgery right knee partial medial meniscectomy    Family History  Problem Relation Age of Onset   Diabetes Other    Cancer Other    Rheum arthritis Other    Allergies  Allergen Reactions   Pollen Extract Other (See Comments)    .   Wheat Other (See Comments)    .   No current facility-administered medications for this encounter.  Current Outpatient Medications:    ALPRAZolam (XANAX) 1 MG tablet, TAKE 1/2 TAB(S) AT BEDTIME AS NEEDED ORALLY 60 DAYS, Disp: , Rfl:    atorvastatin (LIPITOR) 40 MG tablet, TAKE 1 TABLET BY MOUTH EVERYDAY AT BEDTIME, Disp: , Rfl:    buPROPion (WELLBUTRIN XL) 150 MG 24 hr tablet, Take 100 mg by mouth daily., Disp: , Rfl:    canagliflozin (INVOKANA) 100 MG TABS tablet, 1 tab(s) orally once a day; Duration: 90 days, Disp: , Rfl:    cetirizine (ZYRTEC) 10 MG chewable tablet, Chew 10 mg by mouth daily., Disp: , Rfl:    fenofibrate (TRICOR) 145 MG tablet, Take 145 mg by mouth daily., Disp: , Rfl:    FLUoxetine (PROZAC) 20 MG capsule, 1 cap(s) orally once a day; Duration: 90, Disp: , Rfl:    fluticasone (FLONASE) 50 MCG/ACT nasal spray, Place 1 spray into both nostrils daily., Disp: , Rfl:    metFORMIN (GLUCOPHAGE) 500 MG tablet, Take 1,000 mg by mouth 2 (two) times daily with a meal., Disp: , Rfl:     ROS no history of chest pain or shortness of breath       Past Medical History:  Diagnosis Date   Diabetes mellitus without complication (HCC)     Hyperlipidemia     Hypertension                  Past Surgical History:  Procedure Laterality Date   KNEE SURGERY Left      ACL tear meniscal tear, partial meniscectomy no ACL reconstruction          PHYSICAL EXAM  BP 119/60 Comment: 04/16/24  Ht 6' 2 (1.88 m)   Wt 216 lb (98 kg)   BMI 27.73 kg/m  GENERAL appearance reveals no gross abnormalities, normal development grooming and hygiene    MENTAL STATUS we note that the patient is awake alert and oriented to person place and time MOOD/AFFECT ARE NORMAL    GAIT reveals no limp in the effected limb   Ortho Exam   Painless range of motion bilateral hips   Left knee firm endpoint but 1+ Lachman test No other abnormalities, no effusion normal range of motion   Right knee medial joint line tenderness no effusion Pain with hyperflexion Positive Apley Positive McMurray's sign Normal range of motion Normal strength without instability   VASC 2+ dorsalis pedis pulse normal capillary refill excellent warmth to the extremity   NEURO normal sensation and no pathologic reflexes   LYMPH deferred noncontributory   MEDICAL DECISION MAKING   DG Knee AP/LAT W/Sunrise Right  Result Date: 05/09/2024 Report of image right and left knee On the AP views of the right and left knee we see hypertrophy of the medial femoral condyle of the left knee with mild lateral compartment changes, small osteophytes in the peripatellar region on the axial view Tibiofemoral angle valgus Primarily lateral compartment arthrosis On the right side Tibiofemoral valgus as well with no significant changes to suggest joint space narrowing or osteophyte formation normal patellofemoral height and alignment Impression osteoarthritis left knee with osteophytes without major joint space narrowing Normal right knee without osteoarthritis    DG Knee AP/LAT W/Sunrise Left Result Date: 05/09/2024 Report of image right and left knee On the AP views of the right and left knee we see hypertrophy of the medial  femoral condyle of the left knee with mild lateral compartment changes, small osteophytes in the peripatellar region on the axial view Tibiofemoral angle valgus Primarily lateral compartment arthrosis On the right side Tibiofemoral valgus as well with no significant changes to suggest joint space narrowing or osteophyte formation normal patellofemoral height and alignment Impression osteoarthritis left knee with osteophytes without major joint space narrowing Normal right knee without osteoarthritis    No orders of the defined types were placed in this encounter.     Assessment and plan   IMPRESSION:  The radiologist report on the MRI Radial tear to the posterior horn of the medial meniscus near the apex.   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   Small joint effusion.     Electronically signed by: Reyes Frees MD 05/10/2024 09:24 PM EDT RP Workstation: [id number]   IMPRESSION: Radial tear to the posterior horn of the medial meniscus near the apex.   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   Small joint effusion.     Electronically signed by: Reyes Frees MD 05/10/2024 09:24 PM EDT RP Workstation: [id number]

## 2024-06-06 NOTE — Anesthesia Preprocedure Evaluation (Signed)
 Anesthesia Evaluation  Patient identified by MRN, date of birth, ID band Patient awake    Reviewed: Allergy & Precautions, H&P , NPO status , Patient's Chart, lab work & pertinent test results, reviewed documented beta blocker date and time   Airway Mallampati: II  TM Distance: >3 FB Neck ROM: full    Dental no notable dental hx. (+) Dental Advisory Given, Teeth Intact   Pulmonary neg pulmonary ROS   Pulmonary exam normal breath sounds clear to auscultation       Cardiovascular hypertension, Normal cardiovascular exam Rhythm:regular Rate:Normal     Neuro/Psych negative neurological ROS  negative psych ROS   GI/Hepatic negative GI ROS, Neg liver ROS,,,  Endo/Other  diabetes, Type 2    Renal/GU negative Renal ROS  negative genitourinary   Musculoskeletal   Abdominal   Peds  Hematology negative hematology ROS (+)   Anesthesia Other Findings   Reproductive/Obstetrics negative OB ROS                              Anesthesia Physical Anesthesia Plan  ASA: 3  Anesthesia Plan: General   Post-op Pain Management: Dilaudid IV   Induction: Intravenous  PONV Risk Score and Plan: Ondansetron  and Dexamethasone  Airway Management Planned: LMA  Additional Equipment: None  Intra-op Plan:   Post-operative Plan:   Informed Consent: I have reviewed the patients History and Physical, chart, labs and discussed the procedure including the risks, benefits and alternatives for the proposed anesthesia with the patient or authorized representative who has indicated his/her understanding and acceptance.     Dental Advisory Given  Plan Discussed with: CRNA  Anesthesia Plan Comments:          Anesthesia Quick Evaluation

## 2024-06-07 ENCOUNTER — Other Ambulatory Visit: Payer: Self-pay

## 2024-06-07 ENCOUNTER — Ambulatory Visit (HOSPITAL_COMMUNITY)
Admission: RE | Admit: 2024-06-07 | Discharge: 2024-06-07 | Disposition: A | Discharge status: Home / Self Care | Attending: Orthopedic Surgery | Admitting: Orthopedic Surgery

## 2024-06-07 ENCOUNTER — Encounter (HOSPITAL_COMMUNITY): Payer: Self-pay | Admitting: Orthopedic Surgery

## 2024-06-07 ENCOUNTER — Ambulatory Visit (HOSPITAL_COMMUNITY): Admitting: Anesthesiology

## 2024-06-07 ENCOUNTER — Ambulatory Visit (HOSPITAL_BASED_OUTPATIENT_CLINIC_OR_DEPARTMENT_OTHER): Admitting: Anesthesiology

## 2024-06-07 ENCOUNTER — Encounter (HOSPITAL_COMMUNITY)
Admission: RE | Disposition: A | Discharge status: Home / Self Care | Payer: Self-pay | Source: Home / Self Care | Attending: Orthopedic Surgery

## 2024-06-07 DIAGNOSIS — S83249A Other tear of medial meniscus, current injury, unspecified knee, initial encounter: Secondary | ICD-10-CM

## 2024-06-07 DIAGNOSIS — E119 Type 2 diabetes mellitus without complications: Secondary | ICD-10-CM | POA: Insufficient documentation

## 2024-06-07 DIAGNOSIS — I10 Essential (primary) hypertension: Secondary | ICD-10-CM | POA: Diagnosis not present

## 2024-06-07 DIAGNOSIS — S83241A Other tear of medial meniscus, current injury, right knee, initial encounter: Secondary | ICD-10-CM

## 2024-06-07 DIAGNOSIS — X58XXXA Exposure to other specified factors, initial encounter: Secondary | ICD-10-CM | POA: Insufficient documentation

## 2024-06-07 DIAGNOSIS — Z7984 Long term (current) use of oral hypoglycemic drugs: Secondary | ICD-10-CM | POA: Diagnosis not present

## 2024-06-07 HISTORY — PX: KNEE ARTHROSCOPY WITH MEDIAL MENISECTOMY: SHX5651

## 2024-06-07 LAB — GLUCOSE, CAPILLARY
Glucose-Capillary: 211 mg/dL — ABNORMAL HIGH (ref 70–99)
Glucose-Capillary: 196 mg/dL — ABNORMAL HIGH (ref 70–99)

## 2024-06-07 SURGERY — ARTHROSCOPY, KNEE, WITH MEDIAL MENISCECTOMY
Anesthesia: General | Site: Knee | Laterality: Right

## 2024-06-07 MED ORDER — FENTANYL CITRATE (PF) 100 MCG/2ML IJ SOLN
INTRAMUSCULAR | Status: DC | PRN
  Administered 2024-06-07: 25 ug via INTRAVENOUS
  Administered 2024-06-07: 50 ug via INTRAVENOUS
  Administered 2024-06-07: 25 ug via INTRAVENOUS

## 2024-06-07 MED ORDER — ACETAMINOPHEN 160 MG/5ML PO SOLN
960.0000 mg | Freq: Once | ORAL | Status: AC
  Filled 2024-06-07: qty 30

## 2024-06-07 MED ORDER — ORAL CARE MOUTH RINSE: 15.0000 mL | Freq: Once | OROMUCOSAL | Status: AC

## 2024-06-07 MED ORDER — FENTANYL CITRATE (PF) 50 MCG/ML IJ SOSY: 25.0000 ug | PREFILLED_SYRINGE | INTRAMUSCULAR | Status: DC | PRN

## 2024-06-07 MED ORDER — KETOROLAC TROMETHAMINE 30 MG/ML IJ SOLN
INTRAMUSCULAR | Status: AC
  Filled 2024-06-07: qty 1

## 2024-06-07 MED ORDER — HYDROCODONE-ACETAMINOPHEN 5-325 MG PO TABS: 1.0000 | ORAL_TABLET | Freq: Four times a day (QID) | ORAL | 0 refills | Status: AC | PRN

## 2024-06-07 MED ORDER — FENTANYL CITRATE (PF) 100 MCG/2ML IJ SOLN
INTRAMUSCULAR | Status: AC
  Filled 2024-06-07: qty 2

## 2024-06-07 MED ORDER — LACTATED RINGERS IV SOLN: INTRAVENOUS | Status: DC

## 2024-06-07 MED ORDER — ONDANSETRON HCL 4 MG/2ML IJ SOLN
INTRAMUSCULAR | Status: DC | PRN
  Administered 2024-06-07: 4 mg via INTRAVENOUS

## 2024-06-07 MED ORDER — ONDANSETRON HCL 4 MG/2ML IJ SOLN
4.0000 mg | Freq: Once | INTRAMUSCULAR | Status: AC
  Administered 2024-06-07: 4 mg via INTRAVENOUS

## 2024-06-07 MED ORDER — KETOROLAC TROMETHAMINE 30 MG/ML IJ SOLN
15.0000 mg | Freq: Once | INTRAMUSCULAR | Status: DC
  Administered 2024-06-07: 15 mg via INTRAVENOUS

## 2024-06-07 MED ORDER — CHLORHEXIDINE GLUCONATE 0.12 % MT SOLN
15.0000 mL | Freq: Once | OROMUCOSAL | Status: AC
  Administered 2024-06-07: 15 mL via OROMUCOSAL

## 2024-06-07 MED ORDER — KETOROLAC TROMETHAMINE 30 MG/ML IJ SOLN
INTRAMUSCULAR | Status: DC | PRN
  Administered 2024-06-07: 30 mg via INTRAVENOUS

## 2024-06-07 MED ORDER — KETOROLAC TROMETHAMINE 30 MG/ML IJ SOLN
INTRAMUSCULAR | Status: AC
Start: 2024-06-07 — End: 2024-06-07
  Filled 2024-06-07: qty 1

## 2024-06-07 MED ORDER — ACETAMINOPHEN 500 MG PO TABS
1000.0000 mg | ORAL_TABLET | Freq: Once | ORAL | Status: AC
  Administered 2024-06-07: 1000 mg via ORAL
  Filled 2024-06-07: qty 2

## 2024-06-07 MED ORDER — ONDANSETRON HCL 4 MG/2ML IJ SOLN
INTRAMUSCULAR | Status: AC
Start: 2024-06-07 — End: 2024-06-07
  Filled 2024-06-07: qty 2

## 2024-06-07 MED ORDER — HYDROCODONE-ACETAMINOPHEN 5-325 MG PO TABS
ORAL_TABLET | ORAL | Status: AC
  Filled 2024-06-07: qty 1

## 2024-06-07 MED ORDER — CEFAZOLIN SODIUM-DEXTROSE 2-4 GM/100ML-% IV SOLN
2.0000 g | INTRAVENOUS | Status: AC
  Administered 2024-06-07: 2 g via INTRAVENOUS

## 2024-06-07 MED ORDER — LIDOCAINE 2% (20 MG/ML) 5 ML SYRINGE
INTRAMUSCULAR | Status: DC | PRN
  Administered 2024-06-07: 80 mg via INTRAVENOUS

## 2024-06-07 MED ORDER — HYDROCODONE-ACETAMINOPHEN 5-325 MG PO TABS
1.0000 | ORAL_TABLET | Freq: Once | ORAL | Status: AC
  Administered 2024-06-07: 1 via ORAL

## 2024-06-07 MED ORDER — EPINEPHRINE PF 1 MG/ML IJ SOLN
INTRAMUSCULAR | Status: AC
  Filled 2024-06-07: qty 8

## 2024-06-07 MED ORDER — ONDANSETRON HCL 4 MG/2ML IJ SOLN
INTRAMUSCULAR | Status: AC
  Filled 2024-06-07: qty 2

## 2024-06-07 MED ORDER — OXYCODONE HCL 5 MG/5ML PO SOLN: 5.0000 mg | Freq: Once | ORAL | Status: DC | PRN

## 2024-06-07 MED ORDER — SODIUM CHLORIDE 0.9 % IR SOLN
Status: DC | PRN
  Administered 2024-06-07 (×2): 3000 mL

## 2024-06-07 MED ORDER — CHLORHEXIDINE GLUCONATE 0.12 % MT SOLN
OROMUCOSAL | Status: AC
  Filled 2024-06-07: qty 15

## 2024-06-07 MED ORDER — BUPIVACAINE-EPINEPHRINE (PF) 0.5% -1:200000 IJ SOLN
INTRAMUSCULAR | Status: DC | PRN
  Administered 2024-06-07: 30 mL via PERINEURAL

## 2024-06-07 MED ORDER — OXYCODONE HCL 5 MG PO TABS: 5.0000 mg | ORAL_TABLET | Freq: Once | ORAL | Status: DC | PRN

## 2024-06-07 MED ORDER — PROPOFOL 10 MG/ML IV BOLUS
INTRAVENOUS | Status: DC | PRN
  Administered 2024-06-07: 170 mg via INTRAVENOUS

## 2024-06-07 MED ORDER — CEFAZOLIN SODIUM-DEXTROSE 2-4 GM/100ML-% IV SOLN
INTRAVENOUS | Status: AC
  Filled 2024-06-07: qty 100

## 2024-06-07 MED ORDER — BUPIVACAINE-EPINEPHRINE (PF) 0.5% -1:200000 IJ SOLN
INTRAMUSCULAR | Status: AC
  Filled 2024-06-07: qty 30

## 2024-06-07 SURGICAL SUPPLY — 37 items
BAG HAMPER (MISCELLANEOUS) ×1 IMPLANT
BNDG COMPR 6X5.8 VLCR NS LF (GAUZE/BANDAGES/DRESSINGS) ×1 IMPLANT
BNDG ELASTIC 4X5.8 VLCR NS LF (GAUZE/BANDAGES/DRESSINGS) IMPLANT
CHLORAPREP W/TINT 26 (MISCELLANEOUS) ×1 IMPLANT
CLOTH BEACON ORANGE TIMEOUT ST (SAFETY) ×1 IMPLANT
COOLER ICEMAN CLASSIC (MISCELLANEOUS) ×1 IMPLANT
COUNTER NDL MAGNETIC 40 RED (SET/KITS/TRAYS/PACK) ×1 IMPLANT
COUNTER NEEDLE MAGNETIC 40 RED (SET/KITS/TRAYS/PACK) ×1 IMPLANT
DISSECTOR 4.0MM X 13CM (MISCELLANEOUS) IMPLANT
GAUZE SPONGE 4X4 12PLY STRL (GAUZE/BANDAGES/DRESSINGS) IMPLANT
GAUZE XEROFORM 5X9 LF (GAUZE/BANDAGES/DRESSINGS) ×1 IMPLANT
GLOVE BIOGEL PI IND STRL 7.0 (GLOVE) ×2 IMPLANT
GLOVE BIOGEL PI IND STRL 8.5 (GLOVE) ×1 IMPLANT
GLOVE SKINSENSE STRL SZ8.0 LF (GLOVE) ×1 IMPLANT
GOWN STRL REUS W/TWL LRG LVL3 (GOWN DISPOSABLE) ×1 IMPLANT
GOWN STRL REUS W/TWL XL LVL3 (GOWN DISPOSABLE) ×1 IMPLANT
KIT TURNOVER CYSTO (KITS) ×1 IMPLANT
MANIFOLD NEPTUNE II (INSTRUMENTS) ×1 IMPLANT
MARKER SKIN DUAL TIP RULER LAB (MISCELLANEOUS) ×1 IMPLANT
NDL HYPO 18GX1.5 BLUNT FILL (NEEDLE) ×1 IMPLANT
NDL HYPO 21X1.5 SAFETY (NEEDLE) ×1 IMPLANT
NEEDLE HYPO 18GX1.5 BLUNT FILL (NEEDLE) ×1 IMPLANT
NEEDLE HYPO 21X1.5 SAFETY (NEEDLE) ×1 IMPLANT
PACK ARTHROSCOPY WL (CUSTOM PROCEDURE TRAY) ×1 IMPLANT
PAD ABD 5X9 TENDERSORB (GAUZE/BANDAGES/DRESSINGS) ×1 IMPLANT
PAD ARMBOARD POSITIONER FOAM (MISCELLANEOUS) ×1 IMPLANT
PAD COLD SHLDR SM WRAP-ON (PAD) IMPLANT
PADDING CAST COTTON 6X4 STRL (CAST SUPPLIES) ×1 IMPLANT
POSITIONER HEAD 8X9X4 ADT (SOFTGOODS) ×1 IMPLANT
SET ARTHROSCOPY INST (INSTRUMENTS) ×1 IMPLANT
SET BASIN LINEN APH (SET/KITS/TRAYS/PACK) ×1 IMPLANT
SOL .9 NS 3000ML IRR UROMATIC (IV SOLUTION) ×2 IMPLANT
SUT 3-0 BLK 1X30 PSL (SUTURE) ×1 IMPLANT
SYR 10ML LL (SYRINGE) ×1 IMPLANT
SYR 30ML LL (SYRINGE) ×1 IMPLANT
TUBE CONNECTING 12X1/4 (SUCTIONS) ×1 IMPLANT
TUBING IN/OUT FLOW W/MAIN PUMP (TUBING) ×1 IMPLANT

## 2024-06-07 NOTE — Progress Notes (Signed)
 Called patient at the home phone number and cell phone number to see where he was at, we were expecting him at 0615 for his surgery.  No answer at the home number.  Left a voicemail on the cell phone number.

## 2024-06-07 NOTE — Transfer of Care (Signed)
 Immediate Anesthesia Transfer of Care Note  Patient: Sean Buckley  Procedure(s) Performed: ARTHROSCOPY, KNEE, WITH MEDIAL MENISCECTOMY (Right: Knee)  Patient Location: PACU  Anesthesia Type:General  Level of Consciousness: awake  Airway & Oxygen Therapy: Patient Spontanous Breathing  Post-op Assessment: Report given to RN  Post vital signs: Reviewed and stable  Last Vitals:  Vitals Value Taken Time  BP 145/79 06/07/24 10:00  Temp 37 C 06/07/24 10:00  Pulse 79 06/07/24 10:02  Resp 7 06/07/24 10:02  SpO2 99 % 06/07/24 10:02  Vitals shown include unfiled device data.  Last Pain:  Vitals:   06/07/24 0753  TempSrc: Oral  PainSc: 2       Patients Stated Pain Goal: 5 (06/07/24 0753)  Complications: No notable events documented.

## 2024-06-07 NOTE — Interval H&P Note (Signed)
 History and Physical Interval Note:  06/07/2024 8:43 AM  Sean Buckley  has presented today for surgery, with the diagnosis of torn medial meniscus right knee.  The various methods of treatment have been discussed with the patient and family. After consideration of risks, benefits and other options for treatment, the patient has consented to  Procedure(s): ARTHROSCOPY, KNEE, WITH MEDIAL MENISCECTOMY (Right) as a surgical intervention.  The patient's history has been reviewed, patient examined, no change in status, stable for surgery.  I have reviewed the patient's chart and labs.  Questions were answered to the patient's satisfaction.     [name]

## 2024-06-07 NOTE — Anesthesia Postprocedure Evaluation (Signed)
 Anesthesia Post Note  Patient: Sean Buckley  Procedure(s) Performed: ARTHROSCOPY, KNEE, WITH MEDIAL MENISCECTOMY (Right: Knee)  Patient location during evaluation: PACU Anesthesia Type: General Level of consciousness: awake and alert Pain management: pain level controlled Vital Signs Assessment: post-procedure vital signs reviewed and stable Respiratory status: spontaneous breathing, nonlabored ventilation and respiratory function stable Cardiovascular status: blood pressure returned to baseline and stable Postop Assessment: no apparent nausea or vomiting Anesthetic complications: no   There were no known notable events for this encounter.   Last Vitals:  Vitals:   06/07/24 1045 06/07/24 1047  BP: (!) 152/84   Pulse: 75   Resp: 16   Temp:  36.4 C  SpO2: 97%     Last Pain:  Vitals:   06/07/24 1047  TempSrc: Oral  PainSc:                  Carlin LITTIE Kawasaki

## 2024-06-07 NOTE — Op Note (Signed)
 Operative report  Arthroscopy right knee partial medial meniscectomy  Pre-Op diagnosis torn medial meniscus right knee  Postop diagnosis same  Surgeon Margrette  No assistants  LMA general anesthesia  Findings radial tear posterior horn medial meniscus to the capsule  Mild grade 1 and 2 chondral changes medial femoral condyle lateral femoral condyle and patella  No IntraOp complications  Details of procedure  Preop The patient was interviewed reexamined and cleared for surgery.  We confirmed that the right knee was the surgical site the surgeon's initials were placed there.  The MRI was reviewed.  Surgical suite The patient was taken to the operating room placed supine on the operating table LMA anesthesia was administered successfully.  The patient was placed in an arthroscopic leg holder and then prepped and draped sterilely followed by timeout  Surgical details Standard medial lateral portals were established with an 11 blade the portals were injected with Marcaine and epinephrine The scope was placed into the joint through the lateral portal and a diagnostic arthroscopy was performed by looking in each compartment of the knee.  The findings are listed above  Probe was used to characterize the meniscal tear it was a radial tear it went back to the capsule it involve the posterior horn just at the body posterior horn junction.  There was some free edge tearing of the posterior horn itself as well.  A straight biter was used to remove the meniscal fragments and a motorized shaver was used to take the fragments out of the joint and balance the meniscus with the assistance of a 50 degree ArthroCare wand.  The joint was then irrigated any debris was removed.  Marcaine with epinephrine was injected into the knee and the portals were closed with 2 sutures medially and 1 suture laterally using 3-0 nylon in interrupted fashion  Sterile bandages were applied with an Ace wrap  The  patient was extubated and taken to recovery room in stable condition  Postop plan  Full weightbearing Immediate range of motion Cryotherapy Follow-up in 7 to 10 days May shower in 2 days

## 2024-06-07 NOTE — Anesthesia Procedure Notes (Signed)
 Procedure Name: LMA Insertion Date/Time: 06/07/2024 9:05 AM  Performed by: Toribio Darice BRAVO, CRNAPre-anesthesia Checklist: Patient identified, Patient being monitored, Emergency Drugs available, Timeout performed and Suction available Patient Re-evaluated:Patient Re-evaluated prior to induction Oxygen Delivery Method: Circle System Utilized Preoxygenation: Pre-oxygenation with 100% oxygen Induction Type: IV induction Ventilation: Mask ventilation without difficulty LMA: LMA inserted LMA Size: 4.0 Number of attempts: 1 Placement Confirmation: positive ETCO2 and breath sounds checked- equal and bilateral

## 2024-06-08 ENCOUNTER — Encounter (HOSPITAL_COMMUNITY): Payer: Self-pay | Admitting: Orthopedic Surgery

## 2024-06-15 ENCOUNTER — Ambulatory Visit (INDEPENDENT_AMBULATORY_CARE_PROVIDER_SITE_OTHER): Admitting: Orthopedic Surgery

## 2024-06-15 ENCOUNTER — Encounter: Payer: Self-pay | Admitting: Orthopedic Surgery

## 2024-06-15 DIAGNOSIS — Z9889 Other specified postprocedural states: Secondary | ICD-10-CM

## 2024-06-15 DIAGNOSIS — Z96651 Presence of right artificial knee joint: Secondary | ICD-10-CM | POA: Insufficient documentation

## 2024-06-15 NOTE — Progress Notes (Signed)
 Orthopaedic Postop Note  Assessment: Sean Buckley is a 68 y.o. male s/p right knee arthroscopy, partial medial meniscectomy (Dr. Margrette)  DOS: 06/07/2024  Plan: Sutures were removed. Procedure was discussed Exercises have been provided Medication as needed Continue to use ice machine as needed Follow-up with Dr. Margrette in about a month.   Follow-up: Return in about 4 weeks (around 07/13/2024) for Dr. Margrette in 4 weeks. XR at next visit: None  Subjective:  Chief Complaint  Patient presents with   Right knee postop    Right knee arthroscopy with Dr. Margrette    History of Present Illness: Sean Buckley is a 68 y.o. male who presents following the above stated procedure.  Surgery was about a week ago.  He has done well.  He has stopped using the ice machine.  He states he did not require any pain medicine.  His motion is improving.  He is able to bear weight without difficulty.  He has been taking Tylenol  and ibuprofen.  Review of Systems: No fevers or chills No numbness or tingling No Chest Pain No shortness of breath   Objective: There were no vitals taken for this visit.  Physical Exam:  Surgical incisions are healing well.  No surrounding erythema or drainage.  Mild swelling.  Mild bruising over the medial knee.  Range of motion from 3-120 degrees.  Negative Lachman.  No increased laxity varus or valgus stress.  IMAGING: I personally ordered and reviewed the following images:  No new imaging obtained today.  Sean DELENA Horde, MD 06/15/2024 9:42 AM

## 2024-06-15 NOTE — Patient Instructions (Signed)
 Exercises have been provided for you.  Range of motion as tolerated.  Weightbearing as tolerated.  Medicines as needed.  Continue use the ice machine as needed.

## 2024-07-18 ENCOUNTER — Encounter: Payer: Self-pay | Admitting: Orthopedic Surgery

## 2024-07-18 ENCOUNTER — Ambulatory Visit (INDEPENDENT_AMBULATORY_CARE_PROVIDER_SITE_OTHER): Admitting: Orthopedic Surgery

## 2024-07-18 DIAGNOSIS — M1712 Unilateral primary osteoarthritis, left knee: Secondary | ICD-10-CM

## 2024-07-18 DIAGNOSIS — Z9889 Other specified postprocedural states: Secondary | ICD-10-CM

## 2024-07-18 MED ORDER — CELECOXIB 200 MG PO CAPS: 200.0000 mg | ORAL_CAPSULE | Freq: Every day | ORAL | 0 refills | Status: DC

## 2024-07-18 MED ORDER — CELECOXIB 200 MG PO CAPS: 200.0000 mg | ORAL_CAPSULE | Freq: Every day | ORAL | 0 refills | Status: AC

## 2024-07-18 NOTE — Progress Notes (Signed)
 Chief Complaint  Patient presents with   Post-op Follow-up   68 year old male status post right knee arthroscopy and partial medial meniscectomy  Date of surgery June 07, 2024  The patient reports improvement in his overall knee range of motion function and decrease in swelling.  He does have some pain in his left knee as well and some mild medial joint line pain  We discussed possibility of using Celebrex  as he did get good relief from Plaquenil in both knees  (Plaquenil started for cold symptoms that he had has been on that for 8 days with improved knee pain bilaterally)  Knee shows no swelling full range of motion mild medial joint line tenderness  Meds ordered this encounter  Medications   DISCONTD: celecoxib  (CELEBREX ) 200 MG capsule    Sig: Take 1 capsule (200 mg total) by mouth daily.    Dispense:  30 capsule    Refill:  0   celecoxib  (CELEBREX ) 200 MG capsule    Sig: Take 1 capsule (200 mg total) by mouth daily.    Dispense:  30 capsule    Refill:  0    Recheck in 1 month to see if the Celebrex  is helping the arthritic pain patient can start ambulatory walks for exercise

## 2024-07-18 NOTE — Progress Notes (Signed)
" ° ° °  07/18/2024   Chief Complaint  Patient presents with   Post-op Follow-up    Encounter Diagnosis  Name Primary?   S/P right knee arthroscopy 06/07/24 Yes    What pharmacy do you use ? ___________________________  DOI/DOS/ Date: 06/07/24  Did you get better, worse or no change (Answer below)   Improved      "

## 2024-08-19 ENCOUNTER — Encounter: Payer: Self-pay | Admitting: Orthopedic Surgery

## 2024-08-19 ENCOUNTER — Ambulatory Visit (INDEPENDENT_AMBULATORY_CARE_PROVIDER_SITE_OTHER): Admitting: Orthopedic Surgery

## 2024-08-19 DIAGNOSIS — Z9889 Other specified postprocedural states: Secondary | ICD-10-CM

## 2024-08-19 NOTE — Progress Notes (Signed)
" ° ° °  08/19/2024   Chief Complaint  Patient presents with   Post-op Follow-up    Encounter Diagnosis  Name Primary?   S/P right knee arthroscopy 06/07/25 Yes    What pharmacy do you use ? ___[facility]________  DOI/DOS/ Date:    Did you get better, worse or no change (Answer below)   Improved  Did not get the Celebrex  last visit, it was sent to [facility]

## 2024-08-19 NOTE — Progress Notes (Signed)
"  ° °  POST OP VISIT   Patient: Tabari Volkert           Date of Birth: 1956-03-03           MRN: [id number] Visit Date: 08/19/2024 Requested by: Catalina Bare, MD [id number] [place],  [place] [address] PCP: Catalina Bare, MD   Encounter Diagnosis  Name Primary?   S/P right knee arthroscopy 06/07/25 Yes   PROCEDURE: Partial medial meniscectomy  Chief Complaint  Patient presents with   Post-op Follow-up    Allergies[1]   ASSESSMENT AND PLAN:  Dale continues to do well does not require any medication at this time he has regained full range of motion and is happy with his knee.  There is no swelling.  There is no instability  Patient will return as needed    [1]  Allergies Allergen Reactions   Pollen Extract Other (See Comments)    .   Wheat Other (See Comments)    .   "
